# Patient Record
Sex: Female | Born: 1950 | Race: White | Hispanic: No | Marital: Married | Smoking: Former smoker
Health system: Southern US, Community
[De-identification: ages and names within clinical notes are randomized; demographics above are authoritative.]

## PROBLEM LIST (undated history)

## (undated) DIAGNOSIS — G8929 Other chronic pain: Secondary | ICD-10-CM

## (undated) DIAGNOSIS — H8110 Benign paroxysmal vertigo, unspecified ear: Secondary | ICD-10-CM

## (undated) DIAGNOSIS — C50919 Malignant neoplasm of unspecified site of unspecified female breast: Secondary | ICD-10-CM

## (undated) DIAGNOSIS — R51 Headache: Secondary | ICD-10-CM

## (undated) DIAGNOSIS — Z923 Personal history of irradiation: Secondary | ICD-10-CM

## (undated) DIAGNOSIS — G4700 Insomnia, unspecified: Secondary | ICD-10-CM

## (undated) DIAGNOSIS — G5600 Carpal tunnel syndrome, unspecified upper limb: Secondary | ICD-10-CM

## (undated) HISTORY — PX: AUGMENTATION MAMMAPLASTY: SUR837

## (undated) HISTORY — DX: Insomnia, unspecified: G47.00

## (undated) HISTORY — DX: Malignant neoplasm of unspecified site of unspecified female breast: C50.919

## (undated) HISTORY — DX: Headache: R51

## (undated) HISTORY — DX: Benign paroxysmal vertigo, unspecified ear: H81.10

## (undated) HISTORY — DX: Carpal tunnel syndrome, unspecified upper limb: G56.00

## (undated) HISTORY — PX: DIAGNOSTIC LAPAROSCOPY: SUR761

## (undated) HISTORY — DX: Other chronic pain: G89.29

---
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
## 1997-03-16 DIAGNOSIS — C50919 Malignant neoplasm of unspecified site of unspecified female breast: Secondary | ICD-10-CM

## 1997-03-16 HISTORY — DX: Malignant neoplasm of unspecified site of unspecified female breast: C50.919

## 1997-03-16 HISTORY — PX: BREAST LUMPECTOMY: SHX2

## 1997-07-04 ENCOUNTER — Other Ambulatory Visit: Admission: RE | Admit: 1997-07-04 | Discharge: 1997-07-04 | Payer: Self-pay | Admitting: Obstetrics and Gynecology

## 1997-07-23 ENCOUNTER — Other Ambulatory Visit: Admission: RE | Admit: 1997-07-23 | Discharge: 1997-07-23 | Payer: Self-pay | Admitting: Obstetrics and Gynecology

## 1998-02-01 ENCOUNTER — Encounter: Payer: Self-pay | Admitting: *Deleted

## 1998-02-01 ENCOUNTER — Ambulatory Visit (HOSPITAL_BASED_OUTPATIENT_CLINIC_OR_DEPARTMENT_OTHER): Admission: RE | Admit: 1998-02-01 | Discharge: 1998-02-01 | Payer: Self-pay | Admitting: *Deleted

## 1998-02-02 ENCOUNTER — Observation Stay (HOSPITAL_COMMUNITY): Admission: EM | Admit: 1998-02-02 | Discharge: 1998-02-02 | Payer: Self-pay | Admitting: Emergency Medicine

## 1998-02-11 ENCOUNTER — Encounter: Admission: RE | Admit: 1998-02-11 | Discharge: 1998-05-12 | Payer: Self-pay | Admitting: Radiation Oncology

## 1998-05-13 ENCOUNTER — Encounter: Admission: RE | Admit: 1998-05-13 | Discharge: 1998-08-11 | Payer: Self-pay | Admitting: Radiation Oncology

## 1999-01-10 ENCOUNTER — Encounter: Payer: Self-pay | Admitting: *Deleted

## 1999-01-10 ENCOUNTER — Encounter: Admission: RE | Admit: 1999-01-10 | Discharge: 1999-01-10 | Payer: Self-pay | Admitting: *Deleted

## 1999-07-31 ENCOUNTER — Encounter: Payer: Self-pay | Admitting: Obstetrics and Gynecology

## 1999-07-31 ENCOUNTER — Encounter: Admission: RE | Admit: 1999-07-31 | Discharge: 1999-07-31 | Payer: Self-pay | Admitting: Obstetrics and Gynecology

## 2001-03-16 HISTORY — PX: BREAST RECONSTRUCTION: SHX9

## 2001-03-16 HISTORY — PX: BREAST ENHANCEMENT SURGERY: SHX7

## 2002-02-20 ENCOUNTER — Encounter: Payer: Self-pay | Admitting: Obstetrics and Gynecology

## 2002-02-20 ENCOUNTER — Encounter: Admission: RE | Admit: 2002-02-20 | Discharge: 2002-02-20 | Payer: Self-pay | Admitting: Obstetrics and Gynecology

## 2002-04-11 ENCOUNTER — Other Ambulatory Visit: Admission: RE | Admit: 2002-04-11 | Discharge: 2002-04-11 | Payer: Self-pay | Admitting: Oncology

## 2003-03-13 ENCOUNTER — Encounter: Admission: RE | Admit: 2003-03-13 | Discharge: 2003-03-13 | Payer: Self-pay | Admitting: Obstetrics and Gynecology

## 2004-01-25 ENCOUNTER — Encounter (INDEPENDENT_AMBULATORY_CARE_PROVIDER_SITE_OTHER): Payer: Self-pay | Admitting: Specialist

## 2004-01-25 ENCOUNTER — Ambulatory Visit (HOSPITAL_COMMUNITY): Admission: RE | Admit: 2004-01-25 | Discharge: 2004-01-25 | Payer: Self-pay | Admitting: Gastroenterology

## 2004-03-13 ENCOUNTER — Encounter: Admission: RE | Admit: 2004-03-13 | Discharge: 2004-03-13 | Payer: Self-pay | Admitting: Obstetrics and Gynecology

## 2005-04-21 ENCOUNTER — Encounter: Admission: RE | Admit: 2005-04-21 | Discharge: 2005-04-21 | Payer: Self-pay | Admitting: Plastic Surgery

## 2006-06-01 ENCOUNTER — Encounter: Admission: RE | Admit: 2006-06-01 | Discharge: 2006-06-01 | Payer: Self-pay | Admitting: Obstetrics and Gynecology

## 2007-06-27 ENCOUNTER — Encounter: Admission: RE | Admit: 2007-06-27 | Discharge: 2007-06-27 | Payer: Self-pay | Admitting: Obstetrics and Gynecology

## 2009-07-02 ENCOUNTER — Encounter: Admission: RE | Admit: 2009-07-02 | Discharge: 2009-07-02 | Payer: Self-pay | Admitting: Obstetrics and Gynecology

## 2010-06-26 ENCOUNTER — Other Ambulatory Visit: Payer: Self-pay | Admitting: Family Medicine

## 2010-06-26 DIAGNOSIS — Z1231 Encounter for screening mammogram for malignant neoplasm of breast: Secondary | ICD-10-CM

## 2010-07-04 ENCOUNTER — Ambulatory Visit
Admission: RE | Admit: 2010-07-04 | Discharge: 2010-07-04 | Disposition: A | Discharge status: Home / Self Care | Payer: Medicare Other | Source: Ambulatory Visit | Attending: Family Medicine | Admitting: Family Medicine

## 2010-07-04 DIAGNOSIS — Z1231 Encounter for screening mammogram for malignant neoplasm of breast: Secondary | ICD-10-CM

## 2010-08-01 NOTE — Op Note (Signed)
NAMEMELONEY, FELD                ACCOUNT NO.:  [id number]   MEDICAL RECORD NO.:  [id number]          PATIENT TYPE:  AMB   LOCATION:  ENDO                         FACILITY:  [facility]   PHYSICIAN:  Anselmo Rod, M.D.  DATE OF BIRTH:  [birth year]   DATE OF PROCEDURE:  01/25/2004  DATE OF DISCHARGE:                                 OPERATIVE REPORT   PROCEDURE PERFORMED:  Colonoscopy with snare polypectomy x1.   ENDOSCOPIST:  Anselmo Rod, M.D.   INSTRUMENT USED:  Olympus video colonoscope (adjustable pediatric scope).   INDICATION FOR PROCEDURE:  A 60 year old white female with a personal  history of breast cancer undergoing a screening colonoscopy to rule out  colonic polyps, masses, etc.   PREPROCEDURE PREPARATION:  Informed consent was procured from the patient.  The patient was fasted for eight hours prior to the procedure and prepped  with a bottle of magnesium citrate and a gallon of GoLYTELY the night prior  to the procedure.   PREPROCEDURE PHYSICAL:  VITAL SIGNS:  The patient had stable vital signs.  NECK:  Supple.  CHEST:  Clear to auscultation.  S1, S2 regular.  ABDOMEN:  Soft with normal bowel sounds.   DESCRIPTION OF PROCEDURE:  The patient was placed in the left lateral  decubitus position and sedated with 80 mg of Demerol and 8 mg of Versed in  slow incremental doses.  Once the patient was adequately sedate and  maintained on low-flow oxygen and continuous cardiac monitoring, the Olympus  video colonoscope was advanced from the rectum to the cecum.  The  appendiceal orifice and the ileocecal valve were clearly visualized and  photographed.  A flat 4-5 mm sessile polyp was snared from 30 cm.  Retroflexion inspection revealed no abnormality, and the patient tolerated  the procedure well without complications.   IMPRESSION:  1.  Flat lobulated polyp measuring about 5-6 mm snared from 30 cm.  2.  No evidence of diverticulosis, no other masses or polyps seen.  3.   Otherwise normal colonoscopy up to the cecum.   RECOMMENDATIONS:  1.  Await pathology results.  2.  Avoid nonsteroidals including aspirin for the next four weeks.  3.  Repeat colonoscopy depending on pathology results.  4.  Outpatient follow-up as need arises in the future.       JNM/MEDQ  D:  01/25/2004  T:  01/26/2004  Job:  [id number]   cc:   Teena Irani. Arlyce Dice, M.D.  [address]  [place]  [place] [address]  Fax: [phone]   S. Kyra Manges, M.D.  [phone] [address]  [place]  [place] [address]  Fax: [phone]   Valentino Hue. Magrinat, M.D.  [address] [facility]  [place]  [place] [address]  Fax: [phone]

## 2010-11-13 ENCOUNTER — Telehealth: Payer: Self-pay | Admitting: Pulmonary Disease

## 2010-11-13 NOTE — Telephone Encounter (Signed)
Spoke with pt who has upcoming appt with [facility] as a self ref. She states that she is needing [place] to call for records at [facility]. I advised that since they did not refer her, we can not call and get her records without a signed release of info form. Pt verbalized understanding and states will call and get the records. Pt verbalized understanding.

## 2010-11-18 ENCOUNTER — Encounter: Payer: Self-pay | Admitting: Pulmonary Disease

## 2010-11-19 ENCOUNTER — Encounter: Payer: Self-pay | Admitting: Pulmonary Disease

## 2010-11-19 ENCOUNTER — Ambulatory Visit (INDEPENDENT_AMBULATORY_CARE_PROVIDER_SITE_OTHER): Payer: Medicare Other | Admitting: Pulmonary Disease

## 2010-11-19 VITALS — BP 142/102 | HR 63 | Temp 98.1°F | Wt 122.0 lb

## 2010-11-19 DIAGNOSIS — G4700 Insomnia, unspecified: Secondary | ICD-10-CM

## 2010-11-19 NOTE — Progress Notes (Signed)
Subjective:    Patient ID: Mandy Best, female    DOB: 1950-06-14, 60 y.o.   MRN: [id number]  HPI 60 yo female for evaluation of insomnia.  This has been present since at least 1995.  She has trouble falling asleep and staying asleep.  She says that she can stay awake for days.  She will lay down to "rest", but will not actually sleep.  She has been evaluated previously at [facility].  She had sleep test from 1998 which showed unexplained oxygen desaturation with REM sleep.  There was also concern for sleep state misperception.  She does not recall anything specifically which triggered her sleep problems.  She is not able to specify an exact sleep time or wake time.  She says she typically has no sleep.  She feels like she will get to the brink of sleep and then wake up.  She tosses through out the night.  When she can't sleep she will watch TV, read her bible, or do household chores.  She will try to lay down again when she feels tired.  She will typically not start her day until 2 pm, but feels like she can't get out of bed.  She can sometimes lay in bed for up to 2 days.  She thinks she sleeps for only about 2 hours per week, but can "rest" in bed for several hours per day.  In spite of her perceived lack of sleep she is able to keep up with her daily activities.  She has used multiple sleep aides in the past.  None of these helped, and she had trouble with side effects mostly related to nightmares.  She does not feel like she is depressed.  There is no prior history of head injury or seizures.  She denies snoring, or witnessed apnea.  She does not have symptoms of restless legs.  There is no history of sleep hallucinations, sleep paralysis, or cataplexy.  There is no significant history of tobacco abuse, and she does not drink alcohol.  She used to work night shift years ago.  She was also a Geophysicist/field seismologist.  She has been on disability due to her sleep problems and chronic fatigue.  Her  Epworth score is 0 out of 24.  Past Medical History  Diagnosis Date  . Insomnia   . Chronic headaches   . Breast cancer 1999    Left  . Osteoporosis   . BPPV (benign paroxysmal positional vertigo)   . Carpal tunnel syndrome      Family History  Problem Relation Age of Onset  . Diabetes Mother      History   Social History  . Marital Status: Married    Number of Children: 2   Occupational History  . teachers assistant     disabled   Social History Main Topics  . Smoking status: Former Smoker -- 3 years    Quit date: 03/16/1978  . Smokeless tobacco: Not on file   Comment: 1 pack x 2 weeks  . Alcohol Use: No  . Drug Use: No   Allergies  Allergen Reactions  . Ambien   . Amoxicillin   . Benadryl (Diphenhydramine Hcl)   . Claritin   . Flexeril (Cyclobenzaprine Hcl)   . Hydrocodone   . Jasmine Oil   . Mucinex Dm (Mucus Relief Dm)   . Phenylephrine   . Prednisone   . St [employer]   . Sudafed (Pseudoephedrine Hcl)   . Tropicamide   .  Tylenol (Acetaminophen)   . Xanax   . Azithromycin Rash  . Lunesta Rash    Strange dreams  . Prozac (Fluoxetine Hcl) Rash    Strange dreams  . Wellbutrin (Bupropion Hcl) Rash  . Zoloft Rash    Strange dreams    Review of Systems     Objective:   Physical Exam  BP 142/102  Pulse 63  Temp(Src) 98.1 F (36.7 C) (Oral)  Wt 122 lb (55.339 kg)  SpO2 100%  General - thin, healthy HEENT - PERRLA, EOMI, no sinus tenderness, no oral exudate, poor dentition, no LAN, no thyromegaly Cardiac - s1s2 regular, no murmur, pulses symmetric Chest - normal excursion, no wheeze/rales/dullness Abd - soft, nontender, no organomegaly Ext - no e/c/c Neuro - normal strength, CN intact Psych - normal mood, behavior     Assessment & Plan:   Insomnia She has a long history of sleep difficulties.  In spite of having several previous evaluations for this she appears to not have appropriate incite for her problem.  She has continued to  maintain poor sleep habits.  In addition she likely does not perceive how much sleep she is actually getting.  I reviewed the importance of sleep restriction, relaxation techniques, and stimulus control.  I have asked her to keep a sleep diary.  Reviewed proper sleep hygiene techniques.  Advised her that she could try OTC melatonin 3 to 5 mg 30 to 45 minutes before desired bedtime.  Will refrain from using additional sleep aides at this time, since she had difficulty tolerating these and these medications did not seem to be effective.  I emphasized the importance of having realistic expectations for her sleep.  The main focus to try allow her to get enough sleep to maintain her daily functions.  I explained that this problem has been present for years, and will not likely resolve quickly.  I do not think she needs additional sleep testing at present.    Updated Medication List Outpatient Encounter Prescriptions as of 11/19/2010  Medication Sig Dispense Refill  . aspirin 81 MG tablet Take 81 mg by mouth daily.        . calcipotriene-betamethasone (TACLONEX SCALP) external suspension As needed       . Calcium Carb-Cholecalciferol (LIQUID CALCIUM WITH D3 PO) 1 oz daily       . DISCONTD: aspirin 325 MG tablet Take 325 mg by mouth daily.        Marland Kitchen DISCONTD: Eszopiclone (ESZOPICLONE) 3 MG TABS Take 3 mg by mouth at bedtime. Take immediately before bedtime       . DISCONTD: TRAZODONE HCL PO 2 tablets at bedtime

## 2010-11-19 NOTE — Patient Instructions (Signed)
Keep sleep diary for two weeks Go to bed at 130 am, and get out of bed at 730 am. Try melatonin 3 to 5 mg 30 to 45 minutes before bedtime Follow up in 4 weeks

## 2010-11-30 ENCOUNTER — Encounter: Payer: Self-pay | Admitting: Pulmonary Disease

## 2010-11-30 DIAGNOSIS — G4700 Insomnia, unspecified: Secondary | ICD-10-CM | POA: Insufficient documentation

## 2010-11-30 NOTE — Assessment & Plan Note (Addendum)
She has a long history of sleep difficulties.  In spite of having several previous evaluations for this she appears to not have appropriate incite for her problem.  She has continued to maintain poor sleep habits.  In addition she likely does not perceive how much sleep she is actually getting.  I reviewed the importance of sleep restriction, relaxation techniques, and stimulus control.  I have asked her to keep a sleep diary.  Reviewed proper sleep hygiene techniques.  Advised her that she could try OTC melatonin 3 to 5 mg 30 to 45 minutes before desired bedtime.  Will refrain from using additional sleep aides at this time, since she had difficulty tolerating these and these medications did not seem to be effective.  I emphasized the importance of having realistic expectations for her sleep.  The main focus to try allow her to get enough sleep to maintain her daily functions.  I explained that this problem has been present for years, and will not likely resolve quickly.  I do not think she needs additional sleep testing at present.

## 2010-12-04 ENCOUNTER — Telehealth: Payer: Self-pay | Admitting: Pulmonary Disease

## 2010-12-04 NOTE — Telephone Encounter (Signed)
Noted  

## 2010-12-04 NOTE — Telephone Encounter (Signed)
I spoke with pt and she states she has been trying to get up at 7:30 as Dr. Craige Cotta has advised her to do. Pt states she is getting up at 10 and feels like she wants to wait until she starts waking up at 7:30 before she comes back to see Dr. Craige Cotta. She states she has been taking the melatonin as Dr. Craige Cotta advised her too and feels it is helping her sleep. Pt states although she is still feeling tired. Pt states she is still going to continue the sleep dairy and will call back when she is ready for an apt.  I will forward to Dr. Craige Cotta as an Lorain Childes. Please advise Dr. Craige Cotta. Thanks  Carver Fila, CMA

## 2010-12-17 ENCOUNTER — Ambulatory Visit: Payer: Medicare Other | Admitting: Pulmonary Disease

## 2011-05-29 ENCOUNTER — Other Ambulatory Visit: Payer: Self-pay | Admitting: Family Medicine

## 2011-05-29 DIAGNOSIS — Z1231 Encounter for screening mammogram for malignant neoplasm of breast: Secondary | ICD-10-CM

## 2011-06-01 ENCOUNTER — Other Ambulatory Visit: Payer: Self-pay | Admitting: Family Medicine

## 2011-07-06 ENCOUNTER — Ambulatory Visit: Payer: Medicare Other

## 2011-07-06 ENCOUNTER — Ambulatory Visit
Admission: RE | Admit: 2011-07-06 | Discharge: 2011-07-06 | Disposition: A | Discharge status: Home / Self Care | Payer: Self-pay | Source: Ambulatory Visit | Attending: Family Medicine | Admitting: Family Medicine

## 2011-07-06 DIAGNOSIS — Z1231 Encounter for screening mammogram for malignant neoplasm of breast: Secondary | ICD-10-CM

## 2011-08-17 ENCOUNTER — Encounter (HOSPITAL_BASED_OUTPATIENT_CLINIC_OR_DEPARTMENT_OTHER): Payer: Self-pay | Admitting: *Deleted

## 2011-08-21 ENCOUNTER — Encounter (HOSPITAL_BASED_OUTPATIENT_CLINIC_OR_DEPARTMENT_OTHER)
Admission: RE | Disposition: A | Discharge status: Home / Self Care | Payer: Self-pay | Source: Ambulatory Visit | Attending: Plastic Surgery

## 2011-08-21 ENCOUNTER — Ambulatory Visit (HOSPITAL_BASED_OUTPATIENT_CLINIC_OR_DEPARTMENT_OTHER): Payer: Medicare Other | Admitting: Anesthesiology

## 2011-08-21 ENCOUNTER — Ambulatory Visit (HOSPITAL_BASED_OUTPATIENT_CLINIC_OR_DEPARTMENT_OTHER)
Admission: RE | Admit: 2011-08-21 | Discharge: 2011-08-21 | Disposition: A | Discharge status: Home / Self Care | Payer: Medicare Other | Source: Ambulatory Visit | Attending: Plastic Surgery | Admitting: Plastic Surgery

## 2011-08-21 ENCOUNTER — Encounter (HOSPITAL_BASED_OUTPATIENT_CLINIC_OR_DEPARTMENT_OTHER): Payer: Self-pay | Admitting: Anesthesiology

## 2011-08-21 ENCOUNTER — Encounter (HOSPITAL_BASED_OUTPATIENT_CLINIC_OR_DEPARTMENT_OTHER): Payer: Self-pay | Admitting: *Deleted

## 2011-08-21 DIAGNOSIS — Z923 Personal history of irradiation: Secondary | ICD-10-CM | POA: Insufficient documentation

## 2011-08-21 DIAGNOSIS — Y849 Medical procedure, unspecified as the cause of abnormal reaction of the patient, or of later complication, without mention of misadventure at the time of the procedure: Secondary | ICD-10-CM | POA: Insufficient documentation

## 2011-08-21 DIAGNOSIS — Z853 Personal history of malignant neoplasm of breast: Secondary | ICD-10-CM | POA: Insufficient documentation

## 2011-08-21 DIAGNOSIS — T8549XA Other mechanical complication of breast prosthesis and implant, initial encounter: Secondary | ICD-10-CM | POA: Insufficient documentation

## 2011-08-21 LAB — POCT HEMOGLOBIN-HEMACUE: Hemoglobin: 12.7 g/dL (ref 12.0–15.0)

## 2011-08-21 SURGERY — BREAST CAPSULOTOMY WITH IMPLANT EXCHANGE
Anesthesia: General | Site: Breast | Laterality: Right | Wound class: Clean

## 2011-08-21 MED ORDER — METOCLOPRAMIDE HCL 5 MG/ML IJ SOLN
INTRAMUSCULAR | Status: DC | PRN
  Administered 2011-08-21: 10 mg via INTRAVENOUS

## 2011-08-21 MED ORDER — LIDOCAINE HCL (CARDIAC) 20 MG/ML IV SOLN
INTRAVENOUS | Status: DC | PRN
  Administered 2011-08-21: 50 mg via INTRAVENOUS

## 2011-08-21 MED ORDER — PROPOFOL 10 MG/ML IV EMUL
INTRAVENOUS | Status: DC | PRN
  Administered 2011-08-21: 200 mg via INTRAVENOUS

## 2011-08-21 MED ORDER — SODIUM CHLORIDE 0.9 % IR SOLN
Status: DC | PRN
  Administered 2011-08-21: 08:00:00

## 2011-08-21 MED ORDER — EPHEDRINE SULFATE 50 MG/ML IJ SOLN
INTRAMUSCULAR | Status: DC | PRN
  Administered 2011-08-21: 10 mg via INTRAVENOUS

## 2011-08-21 MED ORDER — SCOPOLAMINE 1 MG/3DAYS TD PT72
1.0000 | MEDICATED_PATCH | TRANSDERMAL | Status: DC
  Administered 2011-08-21: 1.5 mg via TRANSDERMAL

## 2011-08-21 MED ORDER — LACTATED RINGERS IV SOLN
INTRAVENOUS | Status: DC
  Administered 2011-08-21 (×2): via INTRAVENOUS

## 2011-08-21 MED ORDER — SODIUM CHLORIDE 0.9 % IR SOLN
Status: DC | PRN
  Administered 2011-08-21: 270 mL

## 2011-08-21 MED ORDER — CLINDAMYCIN PHOSPHATE 600 MG/50ML IV SOLN
600.0000 mg | Freq: Once | INTRAVENOUS | Status: AC
  Administered 2011-08-21: 600 mg via INTRAVENOUS

## 2011-08-21 MED ORDER — FENTANYL CITRATE 0.05 MG/ML IJ SOLN
INTRAMUSCULAR | Status: DC | PRN
  Administered 2011-08-21 (×2): 50 ug via INTRAVENOUS

## 2011-08-21 MED ORDER — SUCCINYLCHOLINE CHLORIDE 20 MG/ML IJ SOLN
INTRAMUSCULAR | Status: DC | PRN
  Administered 2011-08-21: 100 mg via INTRAVENOUS

## 2011-08-21 MED ORDER — ONDANSETRON HCL 4 MG/2ML IJ SOLN
INTRAMUSCULAR | Status: DC | PRN
  Administered 2011-08-21: 4 mg via INTRAVENOUS

## 2011-08-21 MED ORDER — MIDAZOLAM HCL 5 MG/5ML IJ SOLN
INTRAMUSCULAR | Status: DC | PRN
  Administered 2011-08-21: 2 mg via INTRAVENOUS

## 2011-08-21 SURGICAL SUPPLY — 62 items
BAG DECANTER FOR FLEXI CONT (MISCELLANEOUS) ×3 IMPLANT
BANDAGE ELASTIC 6 VELCRO ST LF (GAUZE/BANDAGES/DRESSINGS) ×3 IMPLANT
BLADE HEX COATED 2.75 (ELECTRODE) ×3 IMPLANT
BLADE SURG 10 STRL SS (BLADE) ×3 IMPLANT
BLADE SURG 15 STRL LF DISP TIS (BLADE) ×2 IMPLANT
BLADE SURG 15 STRL SS (BLADE) ×1
CANISTER SUCTION 1200CC (MISCELLANEOUS) ×3 IMPLANT
CHLORAPREP W/TINT 26ML (MISCELLANEOUS) ×3 IMPLANT
CLEANER CAUTERY TIP 5X5 PAD (MISCELLANEOUS) IMPLANT
CLOTH BEACON ORANGE TIMEOUT ST (SAFETY) ×3 IMPLANT
COVER MAYO STAND STRL (DRAPES) ×3 IMPLANT
COVER TABLE BACK 60X90 (DRAPES) ×3 IMPLANT
DECANTER SPIKE VIAL GLASS SM (MISCELLANEOUS) IMPLANT
DERMABOND ADVANCED (GAUZE/BANDAGES/DRESSINGS) ×1
DERMABOND ADVANCED .7 DNX12 (GAUZE/BANDAGES/DRESSINGS) ×2 IMPLANT
DRAPE LAPAROSCOPIC ABDOMINAL (DRAPES) ×3 IMPLANT
DRAPE SURG 17X23 STRL (DRAPES) ×3 IMPLANT
DRSG PAD ABDOMINAL 8X10 ST (GAUZE/BANDAGES/DRESSINGS) ×3 IMPLANT
ELECT BLADE 4.0 EZ CLEAN MEGAD (MISCELLANEOUS)
ELECT REM PT RETURN 9FT ADLT (ELECTROSURGICAL) ×3
ELECTRODE BLDE 4.0 EZ CLN MEGD (MISCELLANEOUS) IMPLANT
ELECTRODE REM PT RTRN 9FT ADLT (ELECTROSURGICAL) ×2 IMPLANT
EVACUATOR SILICONE 100CC (DRAIN) IMPLANT
FILTER 7/8 IN (FILTER) ×3 IMPLANT
GAUZE SPONGE 4X4 12PLY STRL LF (GAUZE/BANDAGES/DRESSINGS) IMPLANT
GAUZE XEROFORM 1X8 LF (GAUZE/BANDAGES/DRESSINGS) IMPLANT
GAUZE XEROFORM 5X9 LF (GAUZE/BANDAGES/DRESSINGS) IMPLANT
GLOVE BIO SURGEON STRL SZ7.5 (GLOVE) ×3 IMPLANT
GLOVE BIOGEL M 7.0 STRL (GLOVE) ×3 IMPLANT
GLOVE BIOGEL PI IND STRL 7.5 (GLOVE) ×2 IMPLANT
GLOVE BIOGEL PI IND STRL 8 (GLOVE) ×2 IMPLANT
GLOVE BIOGEL PI INDICATOR 7.5 (GLOVE) ×1
GLOVE BIOGEL PI INDICATOR 8 (GLOVE) ×1
GOWN PREVENTION PLUS XLARGE (GOWN DISPOSABLE) ×3 IMPLANT
GOWN PREVENTION PLUS XXLARGE (GOWN DISPOSABLE) ×6 IMPLANT
IMPL SALINE MOD PLUS 225CC (Breast) ×2 IMPLANT
IMPLANT SALINE MOD PLUS 225CC (Breast) ×3 IMPLANT
IV NS 500ML (IV SOLUTION) ×1
IV NS 500ML BAXH (IV SOLUTION) ×2 IMPLANT
KIT FILL MCGHAN 30CC (MISCELLANEOUS) ×3 IMPLANT
NEEDLE 27GAX1X1/2 (NEEDLE) IMPLANT
NS IRRIG 1000ML POUR BTL (IV SOLUTION) ×3 IMPLANT
PACK BASIN DAY SURGERY FS (CUSTOM PROCEDURE TRAY) ×3 IMPLANT
PAD CLEANER CAUTERY TIP 5X5 (MISCELLANEOUS)
PAD EYE OVAL STERILE LF (GAUZE/BANDAGES/DRESSINGS) IMPLANT
PIN SAFETY STERILE (MISCELLANEOUS) IMPLANT
SLEEVE SCD COMPRESS KNEE MED (MISCELLANEOUS) IMPLANT
SPONGE GAUZE 4X4 12PLY (GAUZE/BANDAGES/DRESSINGS) ×3 IMPLANT
SPONGE LAP 18X18 X RAY DECT (DISPOSABLE) ×6 IMPLANT
STRIP CLOSURE SKIN 1/2X4 (GAUZE/BANDAGES/DRESSINGS) IMPLANT
SUT MNCRL AB 3-0 PS2 18 (SUTURE) ×3 IMPLANT
SUT MNCRL AB 4-0 PS2 18 (SUTURE) ×3 IMPLANT
SUT VICRYL 3-0 CR8 SH (SUTURE) ×3 IMPLANT
SYR 50ML LL SCALE MARK (SYRINGE) ×3 IMPLANT
SYR BULB IRRIGATION 50ML (SYRINGE) ×6 IMPLANT
TOWEL OR 17X24 6PK STRL BLUE (TOWEL DISPOSABLE) ×6 IMPLANT
TOWEL OR NON WOVEN STRL DISP B (DISPOSABLE) ×3 IMPLANT
TUBE CONNECTING 20X1/4 (TUBING) ×3 IMPLANT
UNDERPAD 30X30 INCONTINENT (UNDERPADS AND DIAPERS) ×6 IMPLANT
VAC PENCILS W/TUBING CLEAR (MISCELLANEOUS) ×3 IMPLANT
WATER STERILE IRR 1000ML POUR (IV SOLUTION) ×3 IMPLANT
YANKAUER SUCT BULB TIP NO VENT (SUCTIONS) ×3 IMPLANT

## 2011-08-21 NOTE — Anesthesia Preprocedure Evaluation (Signed)
Anesthesia Evaluation  Patient identified by MRN, date of birth, ID band Patient awake    Reviewed: Allergy & Precautions, H&P , NPO status , Patient's Chart, lab work & pertinent test results, reviewed documented beta blocker date and time   Airway Mallampati: II TM Distance: >3 FB Neck ROM: full    Dental   Pulmonary neg pulmonary ROS,          Cardiovascular negative cardio ROS      Neuro/Psych  Headaches,  Neuromuscular disease negative psych ROS   GI/Hepatic negative GI ROS, Neg liver ROS,   Endo/Other  negative endocrine ROS  Renal/GU negative Renal ROS  negative genitourinary   Musculoskeletal   Abdominal   Peds  Hematology negative hematology ROS (+)   Anesthesia Other Findings See surgeon's H&P   Reproductive/Obstetrics negative OB ROS                           Anesthesia Physical Anesthesia Plan  ASA: II  Anesthesia Plan: General   Post-op Pain Management:    Induction: Intravenous  Airway Management Planned: Oral ETT  Additional Equipment:   Intra-op Plan:   Post-operative Plan: Extubation in OR  Informed Consent: I have reviewed the patients History and Physical, chart, labs and discussed the procedure including the risks, benefits and alternatives for the proposed anesthesia with the patient or authorized representative who has indicated his/her understanding and acceptance.   Dental Advisory Given  Plan Discussed with: CRNA and Surgeon  Anesthesia Plan Comments:         Anesthesia Quick Evaluation

## 2011-08-21 NOTE — Brief Op Note (Signed)
08/21/2011  8:54 AM  PATIENT:  Mandy Best  61 y.o. female  PRE-OPERATIVE DIAGNOSIS: personal history left breast cancer and mechanical complication of right breast implant  POST-OPERATIVE DIAGNOSIS:  same  PROCEDURE:  Procedure(s) (LRB): BREAST CAPSULOTOMY WITH IMPLANT EXCHANGE (Right)  SURGEON:  Surgeon(s) and Role:    * Etter Sjogren, MD - Primary  PHYSICIAN ASSISTANT:   ASSISTANTS: none   ANESTHESIA:   general  EBL:     BLOOD ADMINISTERED:none  DRAINS: none   LOCAL MEDICATIONS USED:  NONE  SPECIMEN:  No Specimen  DISPOSITION OF SPECIMEN:  N/A  COUNTS:  YES  TOURNIQUET:  * No tourniquets in log *  DICTATION: .Other Dictation: Dictation Number [id number]  PLAN OF CARE: Discharge to home after PACU  PATIENT DISPOSITION:  PACU - hemodynamically stable.   Delay start of Pharmacological VTE agent (>24hrs) due to surgical blood loss or risk of bleeding: yes

## 2011-08-21 NOTE — Anesthesia Procedure Notes (Signed)
Procedure Name: Intubation Date/Time: 08/21/2011 7:57 AM Performed by: Gar Gibbon Pre-anesthesia Checklist: Patient identified, Emergency Drugs available, Suction available and Patient being monitored Patient Re-evaluated:Patient Re-evaluated prior to inductionOxygen Delivery Method: Circle System Utilized Preoxygenation: Pre-oxygenation with 100% oxygen Intubation Type: IV induction Ventilation: Mask ventilation without difficulty Laryngoscope Size: Mac and 3 Grade View: Grade I Tube type: Oral Tube size: 7.0 mm Number of attempts: 1 Airway Equipment and Method: stylet and oral airway Placement Confirmation: ETT inserted through vocal cords under direct vision,  positive ETCO2 and breath sounds checked- equal and bilateral Secured at: 20 cm Tube secured with: Tape Dental Injury: Teeth and Oropharynx as per pre-operative assessment

## 2011-08-21 NOTE — Anesthesia Postprocedure Evaluation (Signed)
Anesthesia Post Note  Patient: Mandy Best  Procedure(s) Performed: Procedure(s) (LRB): BREAST CAPSULOTOMY WITH IMPLANT EXCHANGE (Right)  Anesthesia type: General  Patient location: PACU  Post pain: Pain level controlled  Post assessment: Patient's Cardiovascular Status Stable  Last Vitals:  Filed Vitals:   08/21/11 1100  BP: 140/72  Pulse: 78  Temp:   Resp: 18    Post vital signs: Reviewed and stable  Level of consciousness: alert  Complications: No apparent anesthesia complications

## 2011-08-21 NOTE — Discharge Instructions (Addendum)
No lifting for 2 weeks  No vigorous activity for 2 weeks (including outdoor walks) No driving for 1 week OK to walk up stairs slowly Stay propped up Take an over-the-counter Probiotic while on antibiotics Take an over-the-counter stool softener (such as Colace) while on pain medication Return to office Monday OK to remove dressing and shower tomorrow night. Place sports bra after shower and wear night and day. Post Anesthesia Home Care Instructions  Activity: Get plenty of rest for the remainder of the day. A responsible adult should stay with you for 24 hours following the procedure.  For the next 24 hours, DO NOT: -Drive a car -Advertising copywriter -Drink alcoholic beverages -Take any medication unless instructed by your physician -Make any legal decisions or sign important papers.  Meals: Start with liquid foods such as gelatin or soup. Progress to regular foods as tolerated. Avoid greasy, spicy, heavy foods. If nausea and/or vomiting occur, drink only clear liquids until the nausea and/or vomiting subsides. Call your physician if vomiting continues.  Special Instructions/Symptoms: Your throat may feel dry or sore from the anesthesia or the breathing tube placed in your throat during surgery. If this causes discomfort, gargle with warm salt water. The discomfort should disappear within 24 hours.

## 2011-08-21 NOTE — H&P (Signed)
I have reexamined and reevaluated the patient and there are no changes. See office notes.

## 2011-08-21 NOTE — Op Note (Signed)
NAMESHARYN, BRILLIANT                ACCOUNT NO.:  [id number]  MEDICAL RECORD NO.:  [id number]  LOCATION:                                 FACILITY:  PHYSICIAN:  Etter Sjogren, M.D.          DATE OF BIRTH:  DATE OF PROCEDURE: DATE OF DISCHARGE:                              OPERATIVE REPORT   PREOPERATIVE DIAGNOSES: 1. 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 2. Personal history of breast cancer, left side.  POSTOPERATIVE DIAGNOSES: 1. 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 2. Personal history of breast cancer, left side.  PROCEDURE:  Removal and replacement of a right ruptured breast implant.  SURGEON:  Etter Sjogren, M.D.  ANESTHESIA:  General.  ESTIMATED BLOOD LOSS:  2 mL.  CLINICAL NOTE:  A 61 year old woman with history of breast cancer, lumpectomy, and radiation on the left, reconstruction on the left with implant, and right side augmentation for symmetry.  She has had deflation.  Her surgeries were 12 years ago, and she presents today for exchange of the implant on the right, removal of ruptured implant and replacement.  The nature of procedure, risks, possible complications discussed with her including but not limited to, bleeding, infection, anesthesia related complications, healing problems, scarring, loss of sensation, fluid accumulations, pneumothorax, pulmonary embolism, asymmetry, disappointment, failure of device, capsular contracture, displacement of device, wrinkles, ripples, and diminished sensation of the nipple areolar complex.  She understood all this and wished to proceed.  DESCRIPTION OF PROCEDURE:  The patient was taken to the operating room and placed supine.  After successful induction of general anesthesia, she was prepped with ChloraPrep and after waiting full 3 minutes for drying, she was draped with sterile drapes.  Nipple shields were used. The inframammary crease incision was utilized.  Dissection carried down to the  subcutaneous tissue, through the capsule.  The underlying submuscular space was identified and the ruptured implant was removed. Saline was placed for irrigation.  Antibiotic solution was placed and allowed to dwell and the implant was prepared after thoroughly cleaning gloves.  This was a Mentor smooth, round, moderate plus profile, maximum fill 270 mL implant.  The implant was soaked in antibiotic solution for greater than 5 minutes.  A 100 mL sterile saline placed using closed filling system and the implant was returned to the antibiotic solution. The space was checked.  Excellent hemostasis was confirmed.  A 3-0 Vicryl sutures preplaced but left untied and the implant was then positioned, filled with maximum 270 mL.  Care was taken to make sure that it was oriented properly.  Antibiotic solution was placed in the space prior to position the implant.  A 3-0 Vicryl sutures then tied securely and the wound again irrigated and 3-0 Monocryl interrupted inverted deep dermal sutures and a running 3-0 Monocryl subcuticular suture completed the closure.  Dermabond was applied. Dry sterile dressing and circumferential Ace wrap.  She was transferred to the recovery in stable, having tolerated the procedure well.  DISPOSITION:  She will follow up in the office on Monday.     Etter Sjogren, M.D.     DB/MEDQ  D:  08/21/2011  T:  08/21/2011  Job:  [id number]

## 2011-08-21 NOTE — Transfer of Care (Signed)
Immediate Anesthesia Transfer of Care Note  Patient: Mandy Best  Procedure(s) Performed: Procedure(s) (LRB): BREAST CAPSULOTOMY WITH IMPLANT EXCHANGE (Right)  Patient Location: PACU  Anesthesia Type: General  Level of Consciousness: sedated  Airway & Oxygen Therapy: Patient Spontanous Breathing and Patient connected to face mask oxygen  Post-op Assessment: Report given to PACU RN and Post -op Vital signs reviewed and stable  Post vital signs: Reviewed and stable  Complications: No apparent anesthesia complications

## 2012-06-09 ENCOUNTER — Other Ambulatory Visit: Payer: Self-pay

## 2012-06-09 DIAGNOSIS — Z1231 Encounter for screening mammogram for malignant neoplasm of breast: Secondary | ICD-10-CM

## 2012-06-09 DIAGNOSIS — Z853 Personal history of malignant neoplasm of breast: Secondary | ICD-10-CM

## 2012-06-09 DIAGNOSIS — Z9889 Other specified postprocedural states: Secondary | ICD-10-CM

## 2012-07-29 ENCOUNTER — Ambulatory Visit
Admission: RE | Admit: 2012-07-29 | Discharge: 2012-07-29 | Disposition: A | Discharge status: Home / Self Care | Payer: Medicare Other | Source: Ambulatory Visit

## 2012-07-29 DIAGNOSIS — Z853 Personal history of malignant neoplasm of breast: Secondary | ICD-10-CM

## 2012-07-29 DIAGNOSIS — Z9889 Other specified postprocedural states: Secondary | ICD-10-CM

## 2012-07-29 DIAGNOSIS — Z1231 Encounter for screening mammogram for malignant neoplasm of breast: Secondary | ICD-10-CM

## 2013-07-05 ENCOUNTER — Other Ambulatory Visit: Payer: Self-pay

## 2013-07-05 DIAGNOSIS — Z1231 Encounter for screening mammogram for malignant neoplasm of breast: Secondary | ICD-10-CM

## 2013-07-31 ENCOUNTER — Ambulatory Visit
Admission: RE | Admit: 2013-07-31 | Discharge: 2013-07-31 | Disposition: A | Discharge status: Home / Self Care | Payer: Commercial Managed Care - HMO | Source: Ambulatory Visit

## 2013-07-31 ENCOUNTER — Encounter (INDEPENDENT_AMBULATORY_CARE_PROVIDER_SITE_OTHER): Payer: Self-pay

## 2013-07-31 DIAGNOSIS — Z1231 Encounter for screening mammogram for malignant neoplasm of breast: Secondary | ICD-10-CM

## 2013-12-01 ENCOUNTER — Other Ambulatory Visit: Payer: Self-pay | Admitting: Family Medicine

## 2013-12-01 DIAGNOSIS — M810 Age-related osteoporosis without current pathological fracture: Secondary | ICD-10-CM

## 2013-12-12 ENCOUNTER — Ambulatory Visit
Admission: RE | Admit: 2013-12-12 | Discharge: 2013-12-12 | Disposition: A | Discharge status: Home / Self Care | Payer: Commercial Managed Care - HMO | Source: Ambulatory Visit | Attending: Family Medicine | Admitting: Family Medicine

## 2013-12-12 DIAGNOSIS — M810 Age-related osteoporosis without current pathological fracture: Secondary | ICD-10-CM

## 2013-12-28 ENCOUNTER — Telehealth: Payer: Self-pay | Admitting: Pulmonary Disease

## 2013-12-28 NOTE — Telephone Encounter (Signed)
lmomtcb x1 

## 2013-12-29 NOTE — Telephone Encounter (Signed)
Pt has not been seen since 11/19/10 by Dr. Halford Chessman.  Pending appt 02/23/14. Pt reports she has chronic insomnia/fatigue. She reports her friend takes alprazolam 1 mg at bedtime for the same symptoms. She spoke with PCP and was told she needed to speak with Dr. Halford Chessman regarding this. Pt does not want to wait until 02/23/14 to have something called in. She wants to know if she can see TP for her sleep issues since Dr. Halford Chessman has nothing sooner. Please advise thanks

## 2013-12-29 NOTE — Telephone Encounter (Signed)
Pt has returned call. She will be home for the rest of the day - [phone]

## 2013-12-30 NOTE — Telephone Encounter (Signed)
Mandy Best has not been seen in our office since September 2012.  Mandy Best would need new consultation visit.  Please inform her that we can not prescribe new medication like this without having office evaluation first.  Mandy Best can have initial evaluation with Tammy Parrett, but would like need to review sleep hygiene first before prescribing any new medications to help her sleep.

## 2014-01-01 NOTE — Telephone Encounter (Signed)
Pt has new appt on 12/11 Nothing further needed at this time.

## 2014-01-01 NOTE — Telephone Encounter (Signed)
Called pt, LM to return call Pt needs Consult appt with VS

## 2014-02-23 ENCOUNTER — Ambulatory Visit (INDEPENDENT_AMBULATORY_CARE_PROVIDER_SITE_OTHER): Payer: Medicare HMO | Admitting: Pulmonary Disease

## 2014-02-23 ENCOUNTER — Ambulatory Visit: Payer: Commercial Managed Care - HMO | Admitting: Pulmonary Disease

## 2014-02-23 ENCOUNTER — Encounter (INDEPENDENT_AMBULATORY_CARE_PROVIDER_SITE_OTHER): Payer: Self-pay

## 2014-02-23 ENCOUNTER — Encounter: Payer: Self-pay | Admitting: Pulmonary Disease

## 2014-02-23 ENCOUNTER — Other Ambulatory Visit (INDEPENDENT_AMBULATORY_CARE_PROVIDER_SITE_OTHER): Payer: Medicare HMO

## 2014-02-23 VITALS — BP 134/80 | HR 62 | Ht 62.0 in | Wt 136.0 lb

## 2014-02-23 DIAGNOSIS — G4700 Insomnia, unspecified: Secondary | ICD-10-CM

## 2014-02-23 DIAGNOSIS — R5382 Chronic fatigue, unspecified: Secondary | ICD-10-CM

## 2014-02-23 LAB — COMPREHENSIVE METABOLIC PANEL
ALT: 16 U/L (ref 0–35)
AST: 18 U/L (ref 0–37)
Albumin: 3.9 g/dL (ref 3.5–5.2)
Alkaline Phosphatase: 83 U/L (ref 39–117)
BILIRUBIN TOTAL: 0.6 mg/dL (ref 0.2–1.2)
BUN: 9 mg/dL (ref 6–23)
CO2: 25 meq/L (ref 19–32)
CREATININE: 0.7 mg/dL (ref 0.4–1.2)
Calcium: 9 mg/dL (ref 8.4–10.5)
Chloride: 104 mEq/L (ref 96–112)
GFR: 84.4 mL/min (ref >60.00)
GLUCOSE: 94 mg/dL (ref 70–99)
Potassium: 4 mEq/L (ref 3.5–5.1)
Sodium: 135 mEq/L (ref 135–145)
Total Protein: 7 g/dL (ref 6.0–8.3)

## 2014-02-23 LAB — CBC WITH DIFFERENTIAL/PLATELET
Basophils Absolute: 0 10*3/uL (ref 0.0–0.1)
Basophils Relative: 0.3 % (ref 0.0–3.0)
Eosinophils Absolute: 0.1 10*3/uL (ref 0.0–0.7)
Eosinophils Relative: 1.5 % (ref 0.0–5.0)
HEMATOCRIT: 42 % (ref 36.0–46.0)
Hemoglobin: 14.1 g/dL (ref 12.0–15.0)
LYMPHS ABS: 1.6 10*3/uL (ref 0.7–4.0)
Lymphocytes Relative: 19.2 % (ref 12.0–46.0)
MCHC: 33.5 g/dL (ref 30.0–36.0)
MCV: 86.5 fl (ref 78.0–100.0)
Monocytes Absolute: 0.5 10*3/uL (ref 0.1–1.0)
Monocytes Relative: 5.8 % (ref 3.0–12.0)
NEUTROS ABS: 6.1 10*3/uL (ref 1.4–7.7)
Neutrophils Relative %: 73.2 % (ref 43.0–77.0)
PLATELETS: 327 10*3/uL (ref 150.0–400.0)
RBC: 4.86 Mil/uL (ref 3.87–5.11)
RDW: 12.9 % (ref 11.5–15.5)
WBC: 8.4 10*3/uL (ref 4.0–10.5)

## 2014-02-23 LAB — RHEUMATOID FACTOR: Rhuematoid fact SerPl-aCnc: <10 IU/mL (ref <=14)

## 2014-02-23 LAB — SEDIMENTATION RATE: Sed Rate: 9 mm/hr (ref 0–22)

## 2014-02-23 MED ORDER — ALPRAZOLAM 0.5 MG PO TABS: 0.5000 mg | ORAL_TABLET | Freq: Every day | ORAL | Status: DC

## 2014-02-23 NOTE — Progress Notes (Signed)
Chief Complaint  Patient presents with  . Advice Only    Pt denies cough,sob, wheezing and or chest tightness. She is her for sleep consult.    History of Present Illness: Mandy Best is a 63 y.o. female for evaluation of sleep problems.  I previously saw Mandy Best in September 2012 for insomnia.  She has undergone several previous evaluations at both [facility] and [facility].  She says she has tried everything, and nothing works for her.  She was told by a friend that alprazolam might help her, and therefore she scheduled repeat consultation today.  She has alprazolam listed in her medication allergies, but does not recall ever taking alprazolam.  Her sleep patter is very irregular.  She goes to bed between 8 pm and 11 pm.  If she does not go to bed immediately when she feels sleepy, then her sleep goes away.  She is restless in bed, and sometimes feels dizzy.  She feels like she gets at most 3 hours sleep per night, and describes herself as a light sleeper.  Gets out of bed between 7 am and 1 pm.  She feels like she will just lay in bed to rest, but does not actually get sleep.  She wants to get up in the morning, but feels like she can't because of extreme fatigue.    She denies sleep walking, sleep talking, bruxism, or nightmares.  There is no history of restless legs.  She denies sleep hallucinations, sleep paralysis, or cataplexy.  She is not aware of snoring or apnea.  The Epworth score is 0 out of 24.  Tests:  PMHx >> Breast cancer, headaches, vertigo  Mandy Best  has past surgical history that includes Partial hysterectomy (1972); Breast lumpectomy (1999); Breast enhancement surgery (2003); Breast reconstruction (2003); and Diagnostic laparoscopy (~1998).  Prior to Admission medications   Medication Sig Start Date End Date Taking? Authorizing Provider  AFLURIA PRESERVATIVE FREE 0.5 ML SUSY Inject 0.5 mLs into the muscle once. 12/08/13   Historical Provider, MD  alendronate  (FOSAMAX) 70 MG tablet Take 70 mg by mouth every 7 (seven) days. Take with a full glass of water on an empty stomach.    Historical Provider, MD  aspirin 325 MG tablet Take 650 mg by mouth as needed.    Historical Provider, MD  calcipotriene-betamethasone [facility] SCALP) external suspension Apply topically daily at 10 pm. As needed    Historical Provider, MD  Calcium Carb-Cholecalciferol (LIQUID CALCIUM WITH D3 PO) Take 2 oz by mouth. 2 oz daily    Historical Provider, MD    Allergies  Allergen Reactions  . Haloperidol And Related Swelling    Palpitations, numbness also  . Meclizine Swelling    Palpitations, numbness also  . Viibryd [Vilazodone Hcl] Swelling  . Alendronate Sodium Other (See Comments) and Rash    Other reaction(s): Agitation nightmares  . Alprazolam Rash    Also causes restlessness  . Alprazolam Other (See Comments) and Rash    Insomnia  . Amitriptyline Hcl Other (See Comments) and Rash    Other reaction(s): Agitation nightmares  . Amoxicillin Rash  . Azithromycin Rash  . Benadryl [Diphenhydramine Hcl] Rash  . Besifloxacin Hcl Other (See Comments) and Rash    Other reaction(s): Agitation nightmares  . Clonazepam Other (See Comments) and Rash    Other reaction(s): Agitation nightmares  . Difluprednate Other (See Comments) and Rash    Other reaction(s): Agitation nightmares  . Eszopiclone Rash  Strange dreams  . Fentanyl Other (See Comments) and Rash    Other reaction(s): Agitation nightmares  . Flexeril [Cyclobenzaprine Hcl] Rash  . Gabapentin Other (See Comments) and Rash    Other reaction(s): Agitation nightmares  . Ginkgo Biloba Other (See Comments) and Rash    Other reaction(s): Agitation nightmares  . Hydrocodone Rash    Extreme agitation   . Jasmine Oil Rash  . Loratadine Rash  . Lunesta [Eszopiclone] Rash    Severe agitation  . Mucinex Dm [Dm-Guaifenesin Er] Rash  . Phenylephrine Rash  . Prednisone Rash  . Prozac [Fluoxetine Hcl] Rash     Strange dreams  . Sertraline Hcl Rash    Strange dreams  . St Johns Wort Rash  . Sudafed [Pseudoephedrine Hcl] Rash  . Tropicamide Rash  . Tylenol [Acetaminophen] Rash  . Wellbutrin [Bupropion Hcl] Rash  . Zolpidem Tartrate Rash    Also causes horrible nightmares.    Her family history includes Diabetes in her mother.  She  reports that she quit smoking about 35 years ago. She has never used smokeless tobacco. She reports that she does not drink alcohol or use illicit drugs.   Physical Exam:  General - No distress, disheveled ENT - No sinus tenderness, no oral exudate, no LAN, no thyromegaly, TM clear, pupils equal/reactive Cardiac - s1s2 regular, no murmur, pulses symmetric Chest - No wheeze/rales/dullness, good air entry, normal respiratory excursion Back - No focal tenderness Abd - Soft, non-tender, no organomegaly, + bowel sounds Ext - No edema Neuro - Normal strength, cranial nerves intact Skin - No rashes Psych - anxious  Assessment/plan:  Insomnia >> this has been a long standing problem for years to decades.  I explained this will not be correctly quickly, and might never be fully corrected. Plan: - discussed realistic expectations from sleep - advised her to try sleep restriction, stimulus control, and relaxation techniques >> even though she has tried these unsuccessfully before, continued efforts with these techniques can have benefit  - reviewed proper sleep hygiene and maintaining regular sleep/wake schedule - will have her try alprazolam >> advised her to stop medication and call if she develops any adverse reaction - if she is not improved with alprazolam, then it might be beneficial to try her on a newer agent such as suvorexant or doxepin - she would likely benefit from behavioral health evalution >> will discuss further at next visit - will defer sleep study for now  Fatigue >> this might be a separate condition that is contributing to her  insomnia. Plan: - will check CBC with Diff, CMET, ESR, ANA, RF - she might need evaluation by rheumatologist to further assess   Vineet Sood, M.D. Pager [phone] 

## 2014-02-23 NOTE — Patient Instructions (Signed)
Alprazolam 0.5 mg nightly before bedtime Lab tests today Follow up in 8 weeks

## 2014-02-26 LAB — ANA W/REFLEX IF POSITIVE: ANA: NEGATIVE

## 2014-02-26 LAB — FOLATE: FOLATE: 14.5 ng/mL (ref >5.9)

## 2014-02-26 LAB — VITAMIN B12: Vitamin B-12: 418 pg/mL (ref 211–911)

## 2014-02-27 DIAGNOSIS — R5382 Chronic fatigue, unspecified: Secondary | ICD-10-CM | POA: Insufficient documentation

## 2014-02-28 ENCOUNTER — Telehealth: Payer: Self-pay | Admitting: Pulmonary Disease

## 2014-02-28 NOTE — Telephone Encounter (Signed)
CMP Latest Ref Rng 02/23/2014  Glucose 70 - 99 mg/dL 94  BUN 6 - 23 mg/dL 9  Creatinine 0.4 - 1.2 mg/dL 0.7  Sodium 135 - 145 mEq/L 135  Potassium 3.5 - 5.1 mEq/L 4.0  Chloride 96 - 112 mEq/L 104  CO2 19 - 32 mEq/L 25  Calcium 8.4 - 10.5 mg/dL 9.0  Total Protein 6.0 - 8.3 g/dL 7.0  Total Bilirubin 0.2 - 1.2 mg/dL 0.6  Alkaline Phos 39 - 117 U/L 83  AST 0 - 37 U/L 18  ALT 0 - 35 U/L 16    CBC Latest Ref Rng 02/23/2014 08/21/2011  WBC 4.0 - 10.5 K/uL 8.4 -  Hemoglobin 12.0 - 15.0 g/dL 14.1 12.7  Hematocrit 36.0 - 46.0 % 42.0 -  Platelets 150.0 - 400.0 K/uL 327.0 -    Lab Results  Component Value Date   ESRSEDRATE 9 02/23/2014    Lab Results  Component Value Date   ANA Negative 02/23/2014   RF <10 02/23/2014   Lab Results  Component Value Date   VITAMINB12 418 02/23/2014   Lab Results  Component Value Date   FOLATE 14.5 02/23/2014     Will have my nurse inform pt that lab tests were all normal.

## 2014-03-02 NOTE — Telephone Encounter (Signed)
Results have been explained to patient, pt expressed understanding. Nothing further needed.  

## 2014-03-02 NOTE — Telephone Encounter (Signed)
lmtcb x1 

## 2014-04-25 ENCOUNTER — Encounter: Payer: Self-pay | Admitting: Pulmonary Disease

## 2014-04-25 ENCOUNTER — Ambulatory Visit (INDEPENDENT_AMBULATORY_CARE_PROVIDER_SITE_OTHER): Payer: PPO | Admitting: Pulmonary Disease

## 2014-04-25 ENCOUNTER — Encounter (INDEPENDENT_AMBULATORY_CARE_PROVIDER_SITE_OTHER): Payer: Self-pay

## 2014-04-25 VITALS — BP 122/88 | HR 84 | Ht 62.0 in | Wt 133.0 lb

## 2014-04-25 DIAGNOSIS — D649 Anemia, unspecified: Secondary | ICD-10-CM

## 2014-04-25 DIAGNOSIS — G4700 Insomnia, unspecified: Secondary | ICD-10-CM

## 2014-04-25 MED ORDER — HYDROXYZINE PAMOATE 25 MG PO CAPS: 25.0000 mg | ORAL_CAPSULE | Freq: Three times a day (TID) | ORAL | Status: DC | PRN

## 2014-04-25 MED ORDER — ALPRAZOLAM 0.5 MG PO TABS: 0.5000 mg | ORAL_TABLET | Freq: Every day | ORAL | Status: DC

## 2014-04-25 NOTE — Progress Notes (Signed)
Chief Complaint  Patient presents with  . Follow-up    Pt states that she is able to sleep some with the Xanax    History of Present Illness: Mandy Best is a 64 y.o. female with insomnia.  Her lab work at last visit was normal.  She feels xanax has helped her sleep some, but she gets rash every night.  She has noticed problems with feeling dizzy at times.  This seems to happen most often when she moves her head from different positions.  It will become difficult for her to stay steady when she walks.  She will lay down, and then will feel better eventually.  Past medical hx >> Headaches, breast cancer 1999, osteoporosis, vertigo, carpal tunnel syndrome  Past surgical hx, Medications, Allergies, Family hx, Social hx all reviewed.   Physical Exam: Blood pressure 122/88, pulse 84, height 5\' 2"  (1.575 m), weight 133 lb (60.328 kg), SpO2 97 %. Body mass index is 24.32 kg/(m^2).  General - No distress ENT - No sinus tenderness, no oral exudate, no LAN Cardiac - s1s2 regular, no murmur Chest - No wheeze/rales/dullness Back - No focal tenderness Abd - Soft, non-tender Ext - No edema Neuro - Normal strength Skin - No rashes Psych - normal mood, and behavior   Assessment/Plan:  Insomnia. Plan: - discussed realistic expectations from sleep - advised her to try sleep restriction, stimulus control, and relaxation techniques >> even though she has tried these unsuccessfully before, continued efforts with these techniques can have benefit  - reviewed proper sleep hygiene and maintaining regular sleep/wake schedule - will have her try alprazolam >> she is willing to continue alprazolam in spite of issues with rash - will add vistaril prn    Chesley Mires, MD [place] [facility] Pager:  [phone]

## 2014-04-25 NOTE — Patient Instructions (Addendum)
Try xanax 0.25 mg nightly  Vistaril 25 mg every 8 hours as needed Follow up in 3 months

## 2014-07-06 ENCOUNTER — Other Ambulatory Visit: Payer: Self-pay

## 2014-07-06 DIAGNOSIS — Z1231 Encounter for screening mammogram for malignant neoplasm of breast: Secondary | ICD-10-CM

## 2014-08-10 ENCOUNTER — Ambulatory Visit
Admission: RE | Admit: 2014-08-10 | Discharge: 2014-08-10 | Disposition: A | Discharge status: Home / Self Care | Payer: PPO | Source: Ambulatory Visit

## 2014-08-10 DIAGNOSIS — Z1231 Encounter for screening mammogram for malignant neoplasm of breast: Secondary | ICD-10-CM

## 2014-08-23 ENCOUNTER — Other Ambulatory Visit (INDEPENDENT_AMBULATORY_CARE_PROVIDER_SITE_OTHER): Payer: PPO

## 2014-08-23 ENCOUNTER — Encounter: Payer: Self-pay | Admitting: Pulmonary Disease

## 2014-08-23 ENCOUNTER — Ambulatory Visit (INDEPENDENT_AMBULATORY_CARE_PROVIDER_SITE_OTHER): Payer: PPO | Admitting: Pulmonary Disease

## 2014-08-23 ENCOUNTER — Encounter (INDEPENDENT_AMBULATORY_CARE_PROVIDER_SITE_OTHER): Payer: Self-pay

## 2014-08-23 VITALS — BP 132/80 | HR 73 | Ht 62.0 in | Wt 126.4 lb

## 2014-08-23 DIAGNOSIS — G4700 Insomnia, unspecified: Secondary | ICD-10-CM | POA: Diagnosis not present

## 2014-08-23 DIAGNOSIS — D649 Anemia, unspecified: Secondary | ICD-10-CM

## 2014-08-23 DIAGNOSIS — G2581 Restless legs syndrome: Secondary | ICD-10-CM

## 2014-08-23 LAB — FERRITIN: Ferritin: 21.5 ng/mL (ref 10.0–291.0)

## 2014-08-23 NOTE — Patient Instructions (Signed)
Lab test today Follow up in 6 months 

## 2014-08-23 NOTE — Progress Notes (Signed)
Chief Complaint  Patient presents with  . Follow-up    Pt stopped taking Xanax d/t feeling terrible while taking. Pt explains this as being mentally, physically and emotionally drained. - pt reports having a rash as well and was stuck in bed while taking. Still having issues with Insomnia- has not slept more than 4 hours in the last 48 hours    History of Present Illness: Mandy Best is a 64 y.o. female with insomnia.  She stopped using alprazolam in April.  She felt her sleep was better, but she felt drunk in the morning.  She felt very edgy when taking alprazolam.    She still has trouble with her sleep.  She struggles to get out of bed even though she does not feel like she is sleeping.  It is difficult sometimes for her to keep up with her appointments during the day because of this.  She will also get funny leg feelings at night.  She has to move her legs around to feel comfortable.  This happens several times per week.  Past medical hx >> Headaches, breast cancer 1999, osteoporosis, vertigo, carpal tunnel syndrome  Past surgical hx, Medications, Allergies, Family hx, Social hx all reviewed.   Physical Exam: Blood pressure 122/88, pulse 84, height 5\' 2"  (1.575 m), weight 133 lb (60.328 kg), SpO2 97 %. Body mass index is 23.11 kg/(m^2).  General - No distress ENT - No sinus tenderness, no oral exudate, no LAN Cardiac - s1s2 regular, no murmur Chest - No wheeze/rales/dullness Back - No focal tenderness Abd - Soft, non-tender Ext - No edema Neuro - Normal strength Skin - No rashes Psych - normal mood, and behavior  Discussion: She has difficulty with sleep onset/maintenance insomnia for decades.  She has poor sleep hygiene, and delayed sleep phase.  She has tried multiple sleep aide medications >> usually gets some type of reaction (rash).  She has been seen at [facility] and [facility].  She has been educated on CBT for insomnia.    Most recently she tried alprazolam.  This helped her  sleep, but caused drunk feeling.  She was also more irritable during the day.  Assessment/Plan:  Insomnia. Plan: - she can try vistaril prn - discussed proper sleep hygiene - advised her to maintain regular sleep/wake schedule - she will call if she feels like she wants to try alternative sleep aide medication  Restless leg symptoms. Plan: - will check her ferritin level   Chesley Mires, MD [place] [facility]/Sleep Pager:  [phone]

## 2014-08-24 ENCOUNTER — Telehealth: Payer: Self-pay | Admitting: Pulmonary Disease

## 2014-08-24 NOTE — Telephone Encounter (Signed)
Iron/TIBC/Ferritin/ %Sat    Component Value Date/Time   FERRITIN 21.5 08/23/2014 1622     Will have my nurse inform patient that her iron level is relatively low.  This can contribute to her leg symptoms at night and cause sleep disruption.  She should start taking ferrous sulfate 325 mg bid with meals.  She should take this with vitamin C 100 mg bid.  She should be able to get this OTC w/o prescription.  She should not take iron supplements at the same time she might take calcium supplement or dairy product >> will decrease absorption of iron.

## 2014-08-27 NOTE — Telephone Encounter (Signed)
LM for pt to return call x 1

## 2014-08-28 NOTE — Telephone Encounter (Signed)
I spoke with patient about results and she verbalized understanding and had no questions 

## 2014-08-29 ENCOUNTER — Telehealth: Payer: Self-pay | Admitting: Pulmonary Disease

## 2014-08-29 NOTE — Telephone Encounter (Signed)
It is difficult to get enough iron from food to get iron levels high enough.  She needs to continue with iron supplements and vitamin c.

## 2014-08-29 NOTE — Telephone Encounter (Signed)
Patient notified.  Nothing further needed. 

## 2014-08-29 NOTE — Telephone Encounter (Signed)
Spoke with pt. Currently taking Iron and Vitamin C supplements. Would like to know if there are foods that she can eat in place of these and what VS would recommend.  VS - please advise. Thanks.

## 2014-10-11 ENCOUNTER — Telehealth: Payer: Self-pay | Admitting: Pulmonary Disease

## 2014-10-11 NOTE — Telephone Encounter (Signed)
Patient says that Hydroxazine medication is not working for the rash,  It works for nausea and dizziness.  However, this medication is making her very restless.  She cannot stay still while on this medication.  She said that it is making her irritable.  She wants to know if she should try Emergent C.  Also, can she take liquid iron like Geritol?  Patient aware that Dr. Halford Chessman not in office today and says that this problem has been ongoing for years and can wait for Dr. Halford Chessman to return.  Allergies  Allergen Reactions  . Haloperidol And Related Swelling    Palpitations, numbness also  . Meclizine Swelling    Palpitations, numbness also  . Viibryd [Vilazodone Hcl] Swelling  . Alendronate Sodium Other (See Comments) and Rash    Other reaction(s): Agitation nightmares  . Alprazolam Rash    Also causes restlessness  . Alprazolam Other (See Comments) and Rash    Insomnia  . Amitriptyline Hcl Other (See Comments) and Rash    Other reaction(s): Agitation nightmares  . Amoxicillin Rash  . Azithromycin Rash  . Benadryl [Diphenhydramine Hcl] Rash  . Besifloxacin Hcl Other (See Comments) and Rash    Other reaction(s): Agitation nightmares  . Clonazepam Other (See Comments) and Rash    Other reaction(s): Agitation nightmares  . Difluprednate Other (See Comments) and Rash    Other reaction(s): Agitation nightmares  . Eszopiclone Rash    Strange dreams  . Fentanyl Other (See Comments) and Rash    Other reaction(s): Agitation nightmares  . Flexeril [Cyclobenzaprine Hcl] Rash  . Gabapentin Other (See Comments) and Rash    Other reaction(s): Agitation nightmares  . Ginkgo Biloba Other (See Comments) and Rash    Other reaction(s): Agitation nightmares  . Hydrocodone Rash    Extreme agitation   . Jasmine Oil Rash  . Loratadine Rash  . Lunesta [Eszopiclone] Rash    Severe agitation  . Mucinex Dm [Dm-Guaifenesin Er] Rash  . Phenylephrine Rash  . Prednisone Rash  . Prozac [Fluoxetine Hcl]  Rash    Strange dreams  . Sertraline Hcl Rash    Strange dreams  . St Johns Wort Rash  . Sudafed [Pseudoephedrine Hcl] Rash  . Tropicamide Rash  . Tylenol [Acetaminophen] Rash  . Wellbutrin [Bupropion Hcl] Rash  . Zolpidem Tartrate Rash    Also causes horrible nightmares.

## 2014-10-16 NOTE — Telephone Encounter (Signed)
Called made pt aware of below. She verbalized understanding and nothing further needed

## 2014-10-16 NOTE — Telephone Encounter (Signed)
Okay to take emergent C.  If he leg symptoms are better then she can change from ferrous sulfate to [employer].

## 2014-10-22 ENCOUNTER — Encounter: Payer: Self-pay | Admitting: Pulmonary Disease

## 2015-05-14 ENCOUNTER — Ambulatory Visit: Payer: PPO | Admitting: Pulmonary Disease

## 2015-10-15 ENCOUNTER — Other Ambulatory Visit: Payer: Self-pay | Admitting: Family Medicine

## 2015-10-15 DIAGNOSIS — Z1231 Encounter for screening mammogram for malignant neoplasm of breast: Secondary | ICD-10-CM

## 2015-10-15 DIAGNOSIS — Z9882 Breast implant status: Secondary | ICD-10-CM

## 2015-10-18 ENCOUNTER — Ambulatory Visit
Admission: RE | Admit: 2015-10-18 | Discharge: 2015-10-18 | Disposition: A | Discharge status: Home / Self Care | Payer: PPO | Source: Ambulatory Visit | Attending: Family Medicine | Admitting: Family Medicine

## 2015-10-18 DIAGNOSIS — Z9882 Breast implant status: Secondary | ICD-10-CM

## 2015-10-18 DIAGNOSIS — Z1231 Encounter for screening mammogram for malignant neoplasm of breast: Secondary | ICD-10-CM

## 2015-10-29 ENCOUNTER — Telehealth: Payer: Self-pay | Admitting: Pulmonary Disease

## 2015-10-29 NOTE — Telephone Encounter (Signed)
Attempted to contact patient, left message for patient to return call.

## 2015-10-30 NOTE — Telephone Encounter (Signed)
lmtcb x1 with pt's husband to have her return our call

## 2015-10-30 NOTE — Telephone Encounter (Signed)
Patient returned call, CB is [phone].  If cannot reach her, leave her a msg of a time you will call back so she will make sure she is up.

## 2015-10-30 NOTE — Telephone Encounter (Signed)
Spoke with patient, she will fax a copy of the [employer] to Dr. Halford Chessman.  She is requesting a letter to excuse her from jury duty.   Will send to [facility] for follow up.

## 2015-10-30 NOTE — Telephone Encounter (Signed)
Please advise Dr Halford Chessman if there is any reason as for why this patient cannot attend Jury Duty based on her current medical issues? Thanks.

## 2015-10-31 NOTE — Telephone Encounter (Signed)
I last saw her in June 2016.  She was being monitored for insomnia and restless leg syndrome.  Please explain to her that these diagnoses would not be sufficient to exclude her from jury duty.

## 2015-10-31 NOTE — Telephone Encounter (Signed)
lmtcb x1 for pt. 

## 2015-11-01 NOTE — Telephone Encounter (Signed)
Patient returned call, [phone] and will be there all afternoon.

## 2015-11-01 NOTE — Telephone Encounter (Signed)
Pt states that she is unable to get up early in the AM - today she woke up at 3:15pm Pt states that she does not sleep at night and does not wake up until late in the afternoon daily. Pt states that she will not be able to wake up early enough to be at [employer].   Please advise Dr Halford Chessman. Thanks.

## 2015-11-01 NOTE — Telephone Encounter (Signed)
Please explain to her that the court system is very strict about grant medical exemptions for jury duty, and they would not allow this as an exemption.

## 2015-11-04 NOTE — Telephone Encounter (Signed)
Attempted to call pt but the line keeps disconnecting.  Will try back later.

## 2015-11-05 NOTE — Telephone Encounter (Signed)
lmomtcb x1 for pt 

## 2015-11-05 NOTE — Telephone Encounter (Signed)
Called spoke with pt and made aware of VS response. Nothing further needed

## 2015-11-05 NOTE — Telephone Encounter (Signed)
Pt returning call.Mandy Best ° °

## 2015-11-29 ENCOUNTER — Telehealth: Payer: Self-pay | Admitting: Pulmonary Disease

## 2015-11-29 NOTE — Telephone Encounter (Signed)
Spoke with pt. She reports she is needing to see a chiropractor Dr. Tessa Lerner. He will not see her until she has 2 tests done to see if there are any other causes for her sleep problem. The 2 tests are 1) [place] adrenal sexual hormone panel 2) GI pathogen by PCR, stool. She is requesting Dr. Halford Chessman order these tests since he is seeing her for insomnia. I could not even find a test in EPIC for order #1 she is requesting. Please advise Dr. Halford Chessman thanks

## 2015-12-04 ENCOUNTER — Telehealth: Payer: Self-pay | Admitting: Pulmonary Disease

## 2015-12-04 NOTE — Telephone Encounter (Signed)
I am not sure what a [place] adrenal sexual hormone panel is, and don't know why she would need to have GI pathogen stool PCR ordered by me since I have only seen her for insomnia and restless leg syndrome.  I would not have justification to order these tests based on these diagnoses.   She should d/w her PCP about whether these tests can be arranged.  She should also d/w chiropractic provider about utility of these tests.

## 2015-12-04 NOTE — Telephone Encounter (Signed)
Chesley Mires, MD      1:47 PM  Note    I am not sure what a [place] adrenal sexual hormone panel is, and don't know why she would need to have GI pathogen stool PCR ordered by me since I have only seen her for insomnia and restless leg syndrome.  I would not have justification to order these tests based on these diagnoses.   She should d/w her PCP about whether these tests can be arranged.  She should also d/w chiropractic provider about utility of these tests.     Called spoke with pt. Reviewed VS's recs. She voiced understanding and had no further questions. Nothing further needed.

## 2015-12-04 NOTE — Telephone Encounter (Signed)
Called and spoke with pt and she is aware of VS recs. Nothing further is needed.  

## 2016-09-30 ENCOUNTER — Other Ambulatory Visit: Payer: Self-pay | Admitting: Family Medicine

## 2016-09-30 DIAGNOSIS — Z1231 Encounter for screening mammogram for malignant neoplasm of breast: Secondary | ICD-10-CM

## 2016-10-19 ENCOUNTER — Ambulatory Visit: Payer: PPO

## 2016-10-23 ENCOUNTER — Ambulatory Visit
Admission: RE | Admit: 2016-10-23 | Discharge: 2016-10-23 | Disposition: A | Discharge status: Home / Self Care | Payer: Medicare PPO | Source: Ambulatory Visit | Attending: Family Medicine | Admitting: Family Medicine

## 2016-10-23 DIAGNOSIS — Z1231 Encounter for screening mammogram for malignant neoplasm of breast: Secondary | ICD-10-CM

## 2016-10-23 HISTORY — DX: Personal history of irradiation: Z92.3

## 2017-12-01 ENCOUNTER — Other Ambulatory Visit: Payer: Self-pay | Admitting: Family Medicine

## 2017-12-01 DIAGNOSIS — Z1231 Encounter for screening mammogram for malignant neoplasm of breast: Secondary | ICD-10-CM

## 2017-12-31 ENCOUNTER — Ambulatory Visit: Payer: Medicare PPO

## 2018-01-19 ENCOUNTER — Other Ambulatory Visit: Payer: Self-pay | Admitting: Family Medicine

## 2018-01-19 DIAGNOSIS — M810 Age-related osteoporosis without current pathological fracture: Secondary | ICD-10-CM

## 2018-01-19 DIAGNOSIS — Z13820 Encounter for screening for osteoporosis: Secondary | ICD-10-CM

## 2018-03-01 ENCOUNTER — Ambulatory Visit
Admission: RE | Admit: 2018-03-01 | Discharge: 2018-03-01 | Disposition: A | Discharge status: Home / Self Care | Payer: Medicare PPO | Source: Ambulatory Visit | Attending: Family Medicine | Admitting: Family Medicine

## 2018-03-01 DIAGNOSIS — Z1231 Encounter for screening mammogram for malignant neoplasm of breast: Secondary | ICD-10-CM

## 2018-03-01 DIAGNOSIS — M810 Age-related osteoporosis without current pathological fracture: Secondary | ICD-10-CM

## 2018-03-01 DIAGNOSIS — Z13820 Encounter for screening for osteoporosis: Secondary | ICD-10-CM

## 2019-02-02 ENCOUNTER — Other Ambulatory Visit: Payer: Self-pay | Admitting: Family Medicine

## 2019-02-02 DIAGNOSIS — Z1231 Encounter for screening mammogram for malignant neoplasm of breast: Secondary | ICD-10-CM

## 2019-03-30 ENCOUNTER — Ambulatory Visit: Payer: Medicare PPO

## 2019-04-24 ENCOUNTER — Ambulatory Visit: Payer: Medicare PPO

## 2019-07-12 ENCOUNTER — Ambulatory Visit
Admission: RE | Admit: 2019-07-12 | Discharge: 2019-07-12 | Disposition: A | Discharge status: Home / Self Care | Payer: Medicare PPO | Source: Ambulatory Visit | Attending: Family Medicine | Admitting: Family Medicine

## 2019-07-12 ENCOUNTER — Other Ambulatory Visit: Payer: Self-pay

## 2019-07-12 DIAGNOSIS — Z1231 Encounter for screening mammogram for malignant neoplasm of breast: Secondary | ICD-10-CM

## 2020-06-28 ENCOUNTER — Other Ambulatory Visit: Payer: Self-pay | Admitting: Family Medicine

## 2020-06-28 DIAGNOSIS — Z1231 Encounter for screening mammogram for malignant neoplasm of breast: Secondary | ICD-10-CM

## 2020-10-18 ENCOUNTER — Inpatient Hospital Stay: Admission: RE | Admit: 2020-10-18 | Payer: Medicare PPO | Source: Ambulatory Visit

## 2020-10-22 ENCOUNTER — Other Ambulatory Visit: Payer: Self-pay | Admitting: Family Medicine

## 2020-10-22 DIAGNOSIS — Z1231 Encounter for screening mammogram for malignant neoplasm of breast: Secondary | ICD-10-CM

## 2020-10-29 ENCOUNTER — Ambulatory Visit
Admission: RE | Admit: 2020-10-29 | Discharge: 2020-10-29 | Disposition: A | Discharge status: Home / Self Care | Payer: Medicare PPO | Source: Ambulatory Visit | Attending: Family Medicine | Admitting: Family Medicine

## 2020-10-29 ENCOUNTER — Other Ambulatory Visit: Payer: Self-pay

## 2020-10-29 DIAGNOSIS — Z1231 Encounter for screening mammogram for malignant neoplasm of breast: Secondary | ICD-10-CM

## 2020-12-04 ENCOUNTER — Other Ambulatory Visit: Payer: Self-pay | Admitting: Family Medicine

## 2020-12-04 DIAGNOSIS — Z780 Asymptomatic menopausal state: Secondary | ICD-10-CM

## 2020-12-04 DIAGNOSIS — M810 Age-related osteoporosis without current pathological fracture: Secondary | ICD-10-CM

## 2021-05-20 ENCOUNTER — Other Ambulatory Visit: Payer: Medicare PPO

## 2022-01-21 ENCOUNTER — Other Ambulatory Visit: Payer: Self-pay | Admitting: Family Medicine

## 2022-01-21 DIAGNOSIS — Z1231 Encounter for screening mammogram for malignant neoplasm of breast: Secondary | ICD-10-CM

## 2022-04-17 ENCOUNTER — Ambulatory Visit
Admission: RE | Admit: 2022-04-17 | Discharge: 2022-04-17 | Disposition: A | Discharge status: Home / Self Care | Payer: Medicare PPO | Source: Ambulatory Visit | Attending: Family Medicine | Admitting: Family Medicine

## 2022-04-17 DIAGNOSIS — Z1231 Encounter for screening mammogram for malignant neoplasm of breast: Secondary | ICD-10-CM

## 2022-05-18 IMAGING — MG DIGITAL SCREENING BREAST BILAT IMPLANT W/ TOMO W/ CAD
9 of 12 series · 9 of 28 positions shown · non-contrast
Comparison: Previous exam(s).

CLINICAL DATA: Screening.

EXAM:
DIGITAL SCREENING BILATERAL MAMMOGRAM WITH IMPLANTS, CAD AND
TOMOSYNTHESIS
TECHNIQUE: Bilateral screening digital craniocaudal and mediolateral oblique
mammograms were obtained. Bilateral screening digital breast
tomosynthesis was performed. The images were evaluated with
computer-aided detection. Standard and/or implant displaced views
were performed.

[R CC]
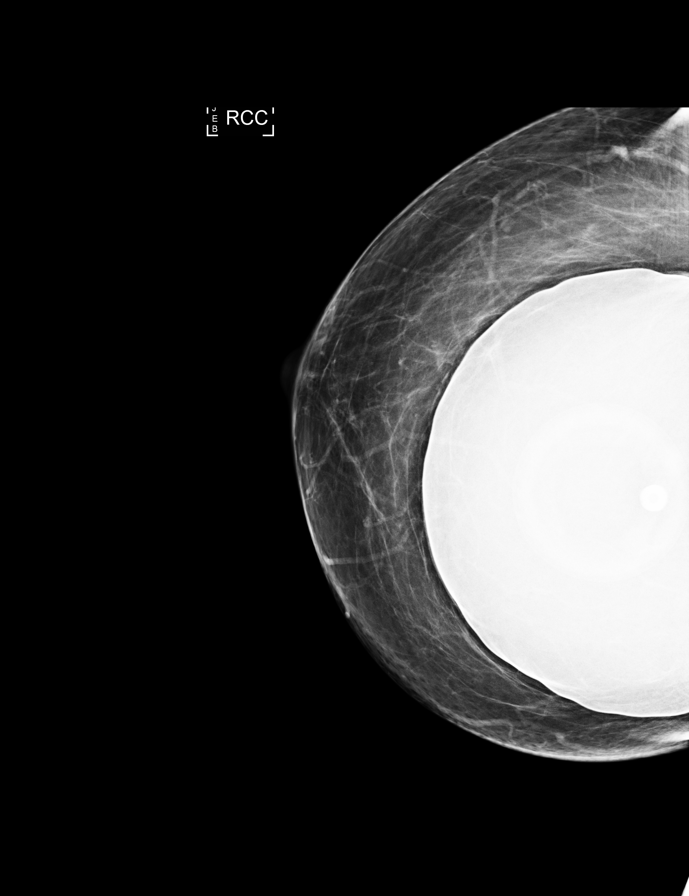

[L MLO]
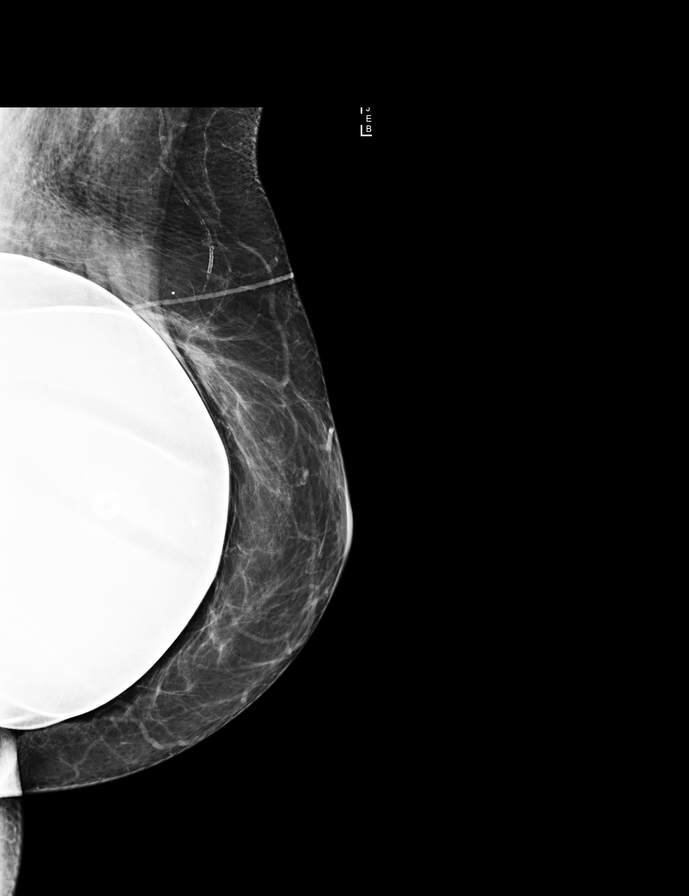

[L CC]
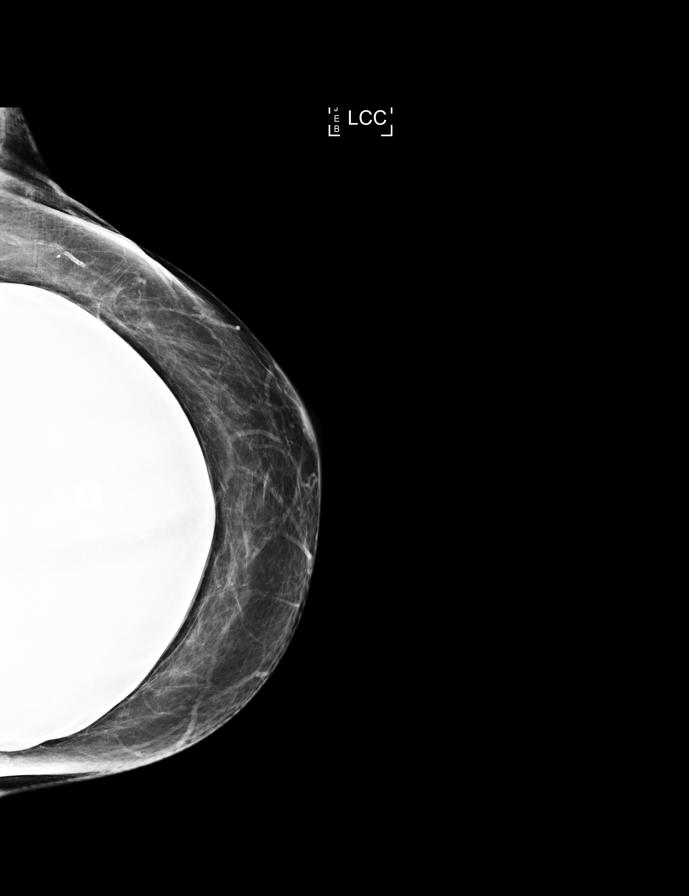

[R MLO]
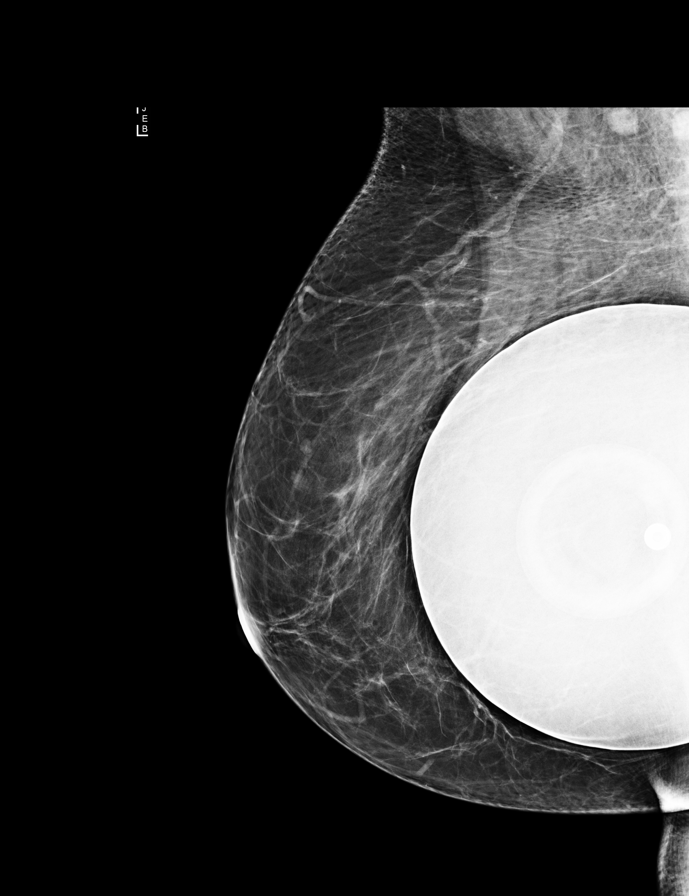

[L MLO synth-2D]
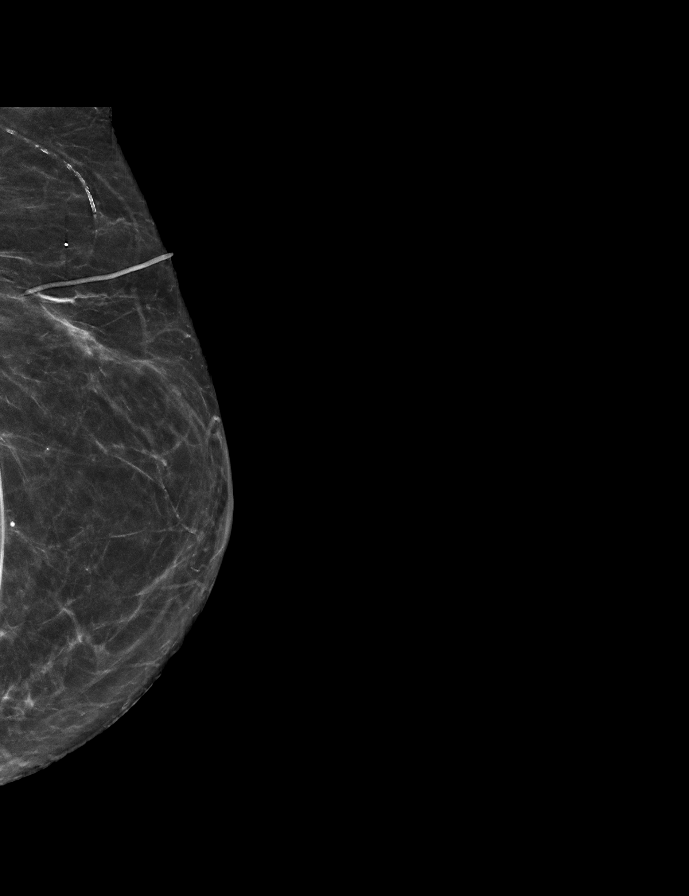

[L CC synth-2D]
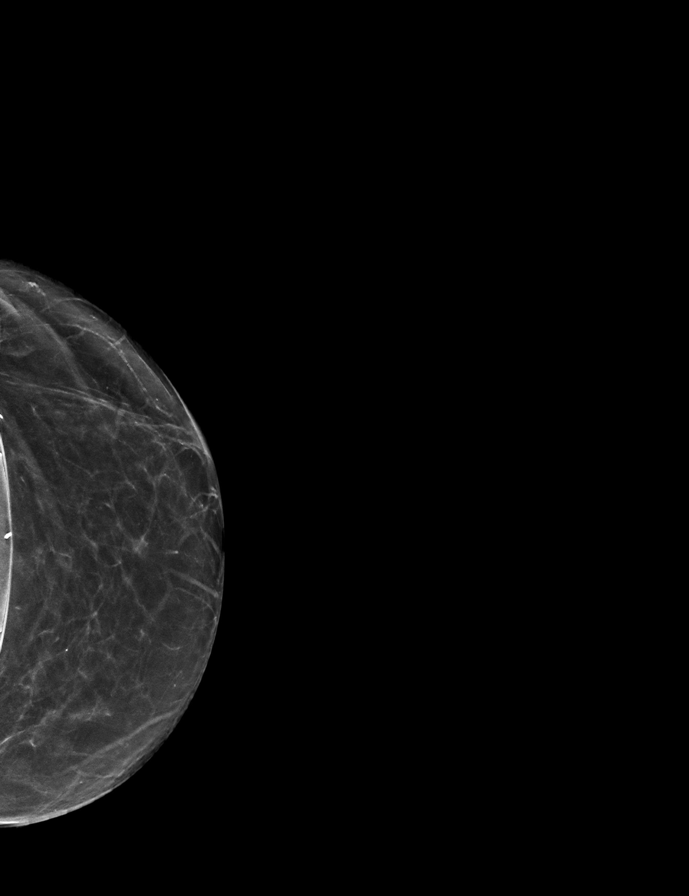

[R CC synth-2D]
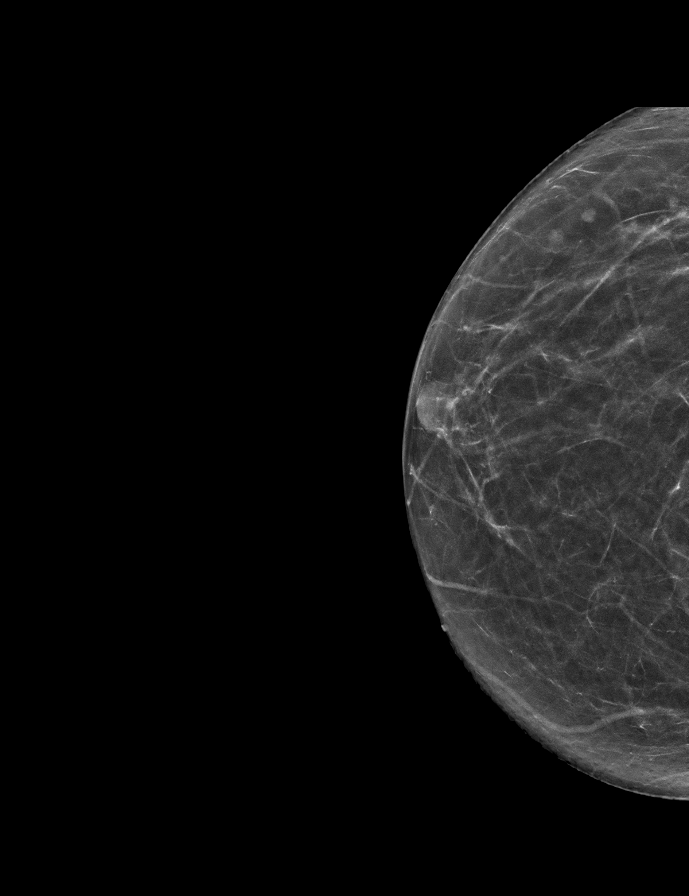

[R MLO synth-2D]
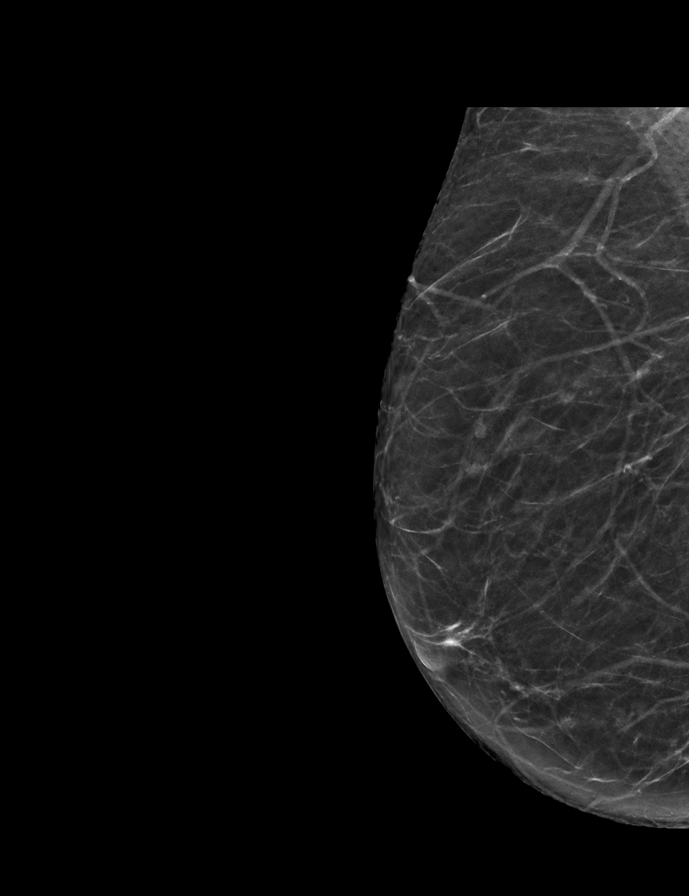

[L MLOID BREAST TOMOSYNTHESIS IMAGE tomo · tomo slice 25/50.0]
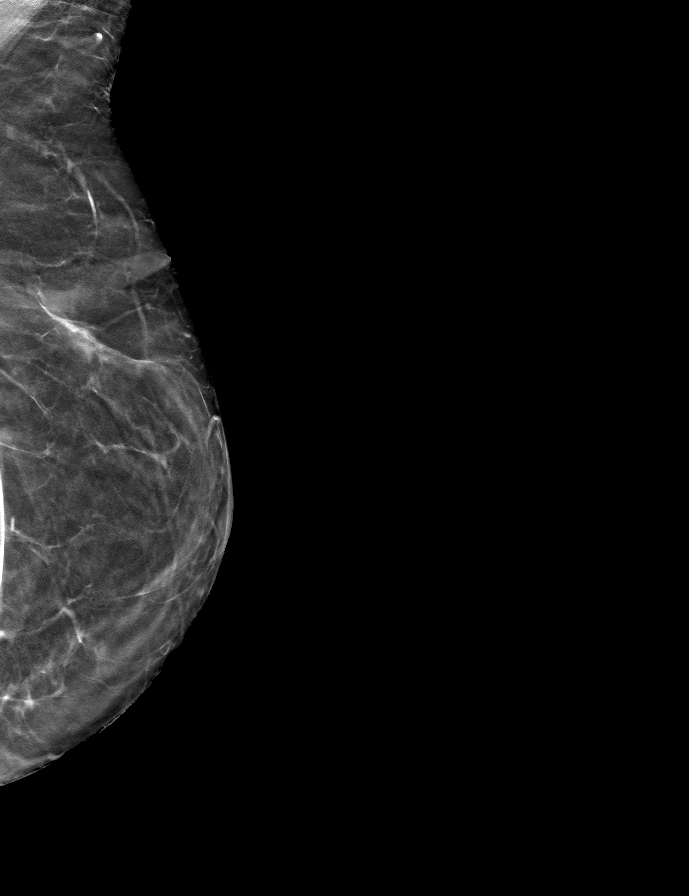

[9 of 28 positions shown; findings below may reference images not displayed]

ACR Breast Density Category b: There are scattered areas of
fibroglandular density.
FINDINGS: The patient has retropectoral implants. There are no findings
suspicious for malignancy.
IMPRESSION: No mammographic evidence of malignancy. A result letter of this
screening mammogram will be mailed directly to the patient.

RECOMMENDATION:
Screening mammogram in one year. (Code:SE-S-JMG)

BI-RADS CATEGORY  1:  Negative.

## 2022-07-10 ENCOUNTER — Other Ambulatory Visit: Payer: Medicare PPO

## 2022-10-19 ENCOUNTER — Emergency Department (HOSPITAL_BASED_OUTPATIENT_CLINIC_OR_DEPARTMENT_OTHER)
Admission: EM | Admit: 2022-10-19 | Discharge: 2022-10-19 | Disposition: A | Discharge status: Home / Self Care | Payer: Medicare PPO | Attending: Emergency Medicine | Admitting: Emergency Medicine

## 2022-10-19 ENCOUNTER — Emergency Department (HOSPITAL_BASED_OUTPATIENT_CLINIC_OR_DEPARTMENT_OTHER): Payer: Medicare PPO

## 2022-10-19 ENCOUNTER — Encounter (HOSPITAL_BASED_OUTPATIENT_CLINIC_OR_DEPARTMENT_OTHER): Payer: Self-pay

## 2022-10-19 ENCOUNTER — Other Ambulatory Visit: Payer: Self-pay

## 2022-10-19 ENCOUNTER — Other Ambulatory Visit (HOSPITAL_BASED_OUTPATIENT_CLINIC_OR_DEPARTMENT_OTHER): Payer: Self-pay

## 2022-10-19 DIAGNOSIS — Z7982 Long term (current) use of aspirin: Secondary | ICD-10-CM | POA: Insufficient documentation

## 2022-10-19 DIAGNOSIS — L03115 Cellulitis of right lower limb: Secondary | ICD-10-CM | POA: Insufficient documentation

## 2022-10-19 DIAGNOSIS — L0390 Cellulitis, unspecified: Secondary | ICD-10-CM

## 2022-10-19 DIAGNOSIS — M79671 Pain in right foot: Secondary | ICD-10-CM | POA: Diagnosis present

## 2022-10-19 LAB — BASIC METABOLIC PANEL
Anion gap: 4 — ABNORMAL LOW (ref 5–15)
BUN: 11 mg/dL (ref 8–23)
CO2: 26 mmol/L (ref 22–32)
Calcium: 9 mg/dL (ref 8.9–10.3)
Chloride: 109 mmol/L (ref 98–111)
Creatinine, Ser: 0.72 mg/dL (ref 0.44–1.00)
GFR, Estimated: >60 mL/min (ref >60)
Glucose, Bld: 94 mg/dL (ref 70–99)
Potassium: 4.1 mmol/L (ref 3.5–5.1)
Sodium: 139 mmol/L (ref 135–145)

## 2022-10-19 LAB — CBC WITH DIFFERENTIAL/PLATELET
Abs Immature Granulocytes: 0.01 10*3/uL (ref 0.00–0.07)
Basophils Absolute: 0.1 10*3/uL (ref 0.0–0.1)
Basophils Relative: 1 %
Eosinophils Absolute: 0.2 10*3/uL (ref 0.0–0.5)
Eosinophils Relative: 2 %
HCT: 39.4 % (ref 36.0–46.0)
Hemoglobin: 13.1 g/dL (ref 12.0–15.0)
Immature Granulocytes: 0 %
Lymphocytes Relative: 21 %
Lymphs Abs: 1.6 10*3/uL (ref 0.7–4.0)
MCH: 29.4 pg (ref 26.0–34.0)
MCHC: 33.2 g/dL (ref 30.0–36.0)
MCV: 88.5 fL (ref 80.0–100.0)
Monocytes Absolute: 0.5 10*3/uL (ref 0.1–1.0)
Monocytes Relative: 7 %
Neutro Abs: 5.3 10*3/uL (ref 1.7–7.7)
Neutrophils Relative %: 69 %
Platelets: 336 10*3/uL (ref 150–400)
RBC: 4.45 MIL/uL (ref 3.87–5.11)
RDW: 12.9 % (ref 11.5–15.5)
WBC: 7.6 10*3/uL (ref 4.0–10.5)
nRBC: 0 % (ref 0.0–0.2)

## 2022-10-19 MED ORDER — DOXYCYCLINE HYCLATE 100 MG PO TABS
100.0000 mg | ORAL_TABLET | Freq: Once | ORAL | Status: AC
  Administered 2022-10-19: 100 mg via ORAL
  Filled 2022-10-19: qty 1

## 2022-10-19 MED ORDER — DOXYCYCLINE HYCLATE 100 MG PO CAPS: 100.0000 mg | ORAL_CAPSULE | Freq: Two times a day (BID) | ORAL | 0 refills | Status: AC

## 2022-10-19 MED ORDER — DOXYCYCLINE HYCLATE 100 MG PO CAPS
100.0000 mg | ORAL_CAPSULE | Freq: Two times a day (BID) | ORAL | 0 refills | Status: DC
  Filled 2022-10-19: qty 20, 10d supply, fill #0

## 2022-10-19 NOTE — ED Triage Notes (Signed)
In for eval of severe swelling and bruising to area to top of right foot. Tingling, burning, and pressure with ambulating. Redness with burst blister area to top of foot. Last Monday 10/12/2022 a 70 lb piece of roofing steel fell onto her foot. Was seen at urgent care same day, xray showed no fracture. Motor and sensation intact.

## 2022-10-19 NOTE — ED Provider Notes (Signed)
[place] [facility] AT [facility] Provider Note   CSN: [id number] Arrival date & time: 10/19/22  1441     History  Chief Complaint  Patient presents with   Foot Pain    Mandy Best is a 72 y.o. female.  Patient here for reevaluation of her right foot bruise/redness.  She had a heavy piece of roofing fall in her foot few weeks ago.  She had no fracture on x-ray done at an urgent care.  But she has had some swelling and now continued bruising and swelling to this area.  Worse when she walks.  No history of diabetes.  Nothing makes it worse or better.  Denies any fevers or chills.  The history is provided by the patient.       Home Medications Prior to Admission medications   Medication Sig Start Date End Date Taking? Authorizing Provider  aspirin EC 81 MG tablet Take 81 mg by mouth daily. Swallow whole.   Yes [provider]  doxycycline (VIBRAMYCIN) 100 MG capsule Take 1 capsule (100 mg total) by mouth 2 (two) times daily. 10/19/22  Yes Alexsia Klindt, DO  aspirin 325 MG tablet Take 650 mg by mouth as needed.    [provider]      Allergies    Haloperidol and related, Meclizine, Viibryd [vilazodone hcl], Ibuprofen, Kava kava, Prozac [fluoxetine], Trazodone and nefazodone, Alendronate sodium, Alprazolam, Alprazolam, Amitriptyline hcl, Amoxicillin, Azithromycin, Benadryl [diphenhydramine hcl], Besifloxacin hcl, Clonazepam, Difluprednate, Eszopiclone, Fentanyl, Flexeril [cyclobenzaprine hcl], Gabapentin, Ginkgo biloba, Hydrocodone, Jasmine oil, Loratadine, Lunesta [eszopiclone], Mucinex dm [dm-guaifenesin er], Phenylephrine, Prednisone, Prozac [fluoxetine hcl], Sertraline hcl, St johns wort, Sudafed [pseudoephedrine hcl], Tropicamide, Tylenol [acetaminophen], Wellbutrin [bupropion hcl], and Zolpidem tartrate    Review of Systems   Review of Systems  Physical Exam Updated Vital Signs BP (!) 173/80   Pulse 69   Temp 98.8 F (37.1 C)  (Oral)   Resp 18   Ht 5\' 2"  (1.575 m)   Wt 64.9 kg   SpO2 98%   BMI 26.16 kg/m  Physical Exam Vitals and nursing note reviewed.  Constitutional:      General: She is not in acute distress.    Appearance: She is well-developed. She is not ill-appearing.  HENT:     Head: Normocephalic and atraumatic.     Nose: Nose normal.     Mouth/Throat:     Mouth: Mucous membranes are moist.  Eyes:     Extraocular Movements: Extraocular movements intact.     Conjunctiva/sclera: Conjunctivae normal.     Pupils: Pupils are equal, round, and reactive to light.  Cardiovascular:     Rate and Rhythm: Normal rate and regular rhythm.     Pulses: Normal pulses.     Heart sounds: Normal heart sounds. No murmur heard. Pulmonary:     Effort: Pulmonary effort is normal. No respiratory distress.     Breath sounds: Normal breath sounds.  Abdominal:     Palpations: Abdomen is soft.     Tenderness: There is no abdominal tenderness.  Musculoskeletal:        General: Swelling and tenderness present.     Cervical back: Normal range of motion and neck supple.     Comments: Tenderness and swelling to the top of the right foot, little bit of fluctuance and redness, picture in the media file  Skin:    General: Skin is warm and dry.     Capillary Refill: Capillary refill takes less than 2 seconds.  Neurological:     General: No focal deficit present.     Mental Status: She is alert.  Psychiatric:        Mood and Affect: Mood normal.     ED Results / Procedures / Treatments   Labs (all labs ordered are listed, but only abnormal results are displayed) Labs Reviewed  BASIC METABOLIC PANEL - Abnormal; Notable for the following components:      Result Value   Anion gap 4 (*)    All other components within normal limits  CBC WITH DIFFERENTIAL/PLATELET    EKG None  Radiology DG Foot Complete Right  Result Date: 10/19/2022 CLINICAL DATA:  Foot pain. Swelling and bruising. Object fell onto foot  10/12/2022, initial imaging demonstrated no fracture. EXAM: RIGHT FOOT COMPLETE - 3+ VIEW COMPARISON:  None Available. FINDINGS: No acute or healing fracture of the foot. The alignment is normal, no dislocation. Minor degenerative change of the first metatarsal phalangeal joint. No erosions or periostitis. Generalized soft tissue edema most prominently over the dorsum of the foot. No radiopaque foreign body or soft tissue gas. IMPRESSION: Generalized soft tissue edema, prominent over the dorsum of the foot. No acute or healing fracture. Electronically Signed   By: Narda Rutherford M.D.   On: 10/19/2022 16:30    Procedures Procedures    Medications Ordered in ED Medications  doxycycline (VIBRA-TABS) tablet 100 mg (has no administration in time range)    ED Course/ Medical Decision Making/ A&P                                 Medical Decision Making Amount and/or Complexity of Data Reviewed Labs: ordered. Radiology: ordered.  Risk Prescription drug management.   Mandy Best is here with right foot pain.  Unremarkable vitals.  No real major significant medical problems.  No history of diabetes.  Differential diagnosis likely hematoma versus may be infectious process.  Basic labs were obtained that showed no significant anemia or electrolyte abnormality or kidney injury or leukocytosis.  Patient with x-ray that shows no fracture.  She is got swelling over the top of the right foot.  Could be a traumatic hematoma now with some cellulitis.  I talked with Dr. Everardo Pacific PA McBane who recommends starting antibiotics and they will follow-up with her outpatient.  Will put her in a postop shoe.  They recommend against doing an I&D.  I suspect that this is a traumatic hematoma.  Overall she is neurovascularly neuromuscular intact on exam.  She understands return precautions and understands follow-up instructions.  Given a dose of doxycycline before leaving here.  I offered her narcotic pain medicine for  home but she declined.  This chart was dictated using voice recognition software.  Despite best efforts to proofread,  errors can occur which can change the documentation meaning.         Final Clinical Impression(s) / ED Diagnoses Final diagnoses:  Right foot pain  Cellulitis, unspecified cellulitis site    Rx / DC Orders ED Discharge Orders          Ordered    doxycycline (VIBRAMYCIN) 100 MG capsule  2 times daily        10/19/22 1821              Virgina Norfolk, DO 10/19/22 1826

## 2022-10-19 NOTE — ED Notes (Signed)
 RN reviewed discharge instructions with pt. Pt verbalized understanding and had no further questions. VSS upon discharge.  

## 2022-10-19 NOTE — Discharge Instructions (Addendum)
Take antibiotics as prescribed.  Take next dose tomorrow morning.  Use postop boot for comfort.  Call orthopedics tomorrow to schedule follow-up appointment.

## 2022-10-26 ENCOUNTER — Ambulatory Visit: Payer: Medicare PPO | Admitting: Orthopedic Surgery

## 2022-10-26 ENCOUNTER — Encounter: Payer: Self-pay | Admitting: Orthopedic Surgery

## 2022-10-26 DIAGNOSIS — S9781XA Crushing injury of right foot, initial encounter: Secondary | ICD-10-CM

## 2022-10-26 NOTE — Progress Notes (Signed)
Office Visit Note   Patient: Mandy Best           Date of Birth: 02/24/1951           MRN: [id number] Visit Date: 10/26/2022              Requested by: Vivien Presto, MD [phone] [address] [place],  [place] [address] PCP: Vivien Presto, MD  Chief Complaint  Patient presents with   Right Foot - Pain      HPI: Patient is a 72 year old woman is seen for initial evaluation for crush injury to her right foot.  She states that a stack of metal roofing fell on the dorsum of her foot a few weeks ago.  Patient states the swelling is improving.  Patient has been on doxycycline stopped this yesterday.  Assessment & Plan: Visit Diagnoses:  1. Crushing injury of right foot, initial encounter     Plan: Recommended she continue with range of motion of the toes and ankle.  She may stop her doxycycline.  Continue compression and elevation.  Follow-Up Instructions: Return in about 4 weeks (around 11/23/2022).   Ortho Exam  Patient is alert, oriented, no adenopathy, well-dressed, normal affect, normal respiratory effort. Examination of the radiographs shows no fractures no evidence of injury across the Lisfranc complex.  Patient has a palpable posterior tibial pulse.  The foot is swollen and she has good active and passive range of motion of her toes without pain no evidence of a compartment syndrome.  There is a superficial 2 cm eschar over the dorsum of the midfoot there is no cellulitis odor or drainage no full-thickness skin defect.  Imaging: No results found. No images are attached to the encounter.  Labs: Lab Results  Component Value Date   ESRSEDRATE 9 02/23/2014     Lab Results  Component Value Date   ALBUMIN 3.9 02/23/2014    No results found for: "MG" No results found for: "VD25OH"  No results found for: "PREALBUMIN"    Latest Ref Rng & Units 10/19/2022    5:04 PM 02/23/2014    4:47 PM 08/21/2011    6:52 AM  CBC EXTENDED  WBC 4.0 - 10.5 K/uL 7.6  8.4    RBC  3.87 - 5.11 MIL/uL 4.45  4.86    Hemoglobin 12.0 - 15.0 g/dL 78.2  95.6  21.3   HCT 36.0 - 46.0 % 39.4  42.0    Platelets 150 - 400 K/uL 336  327.0    NEUT# 1.7 - 7.7 K/uL 5.3  6.1    Lymph# 0.7 - 4.0 K/uL 1.6  1.6       There is no height or weight on file to calculate BMI.  Orders:  No orders of the defined types were placed in this encounter.  No orders of the defined types were placed in this encounter.    Procedures: No procedures performed  Clinical Data: No additional findings.  ROS:  All other systems negative, except as noted in the HPI. Review of Systems  Objective: Vital Signs: There were no vitals taken for this visit.  Specialty Comments:  No specialty comments available.  PMFS History: Patient Active Problem List   Diagnosis Date Noted   Chronic fatigue 02/27/2014   Insomnia 11/30/2010   Past Medical History:  Diagnosis Date   BPPV (benign paroxysmal positional vertigo)    Breast cancer (HCC) 1999   Left   Carpal tunnel syndrome  Chronic headaches    Insomnia    Osteoporosis    Personal history of radiation therapy     Family History  Problem Relation Age of Onset   Diabetes Mother     Past Surgical History:  Procedure Laterality Date   AUGMENTATION MAMMAPLASTY Bilateral    BREAST ENHANCEMENT SURGERY  2003   BREAST LUMPECTOMY Left 1999   BREAST RECONSTRUCTION  2003   Bilateral   DIAGNOSTIC LAPAROSCOPY  ~1998   ovarian cyst removal   PARTIAL HYSTERECTOMY  1972   Social History   Occupational History   Occupation: Research officer, trade [employer]    Comment: disabled  Tobacco Use   Smoking status: Former    Current packs/day: 0.00    Types: Cigarettes    Start date: 03/17/1975    Quit date: 03/16/1978    Years since quitting: 44.6   Smokeless tobacco: Never   Tobacco comments:    1 pack x 2 weeks  Vaping Use   Vaping status: Never Used  Substance and Sexual Activity   Alcohol use: No   Drug use: No   Sexual activity: Not on file

## 2022-10-28 ENCOUNTER — Telehealth: Payer: Self-pay | Admitting: Orthopedic Surgery

## 2022-10-28 NOTE — Telephone Encounter (Signed)
Called patient

## 2022-10-28 NOTE — Telephone Encounter (Signed)
Pt called in for [facility] May left VM yesterday, pt would like for her to call it is very important concerning her visit with Dr. Lajoyce Corners. She was transferred to [facility] but would still like to speak with Toniann Fail, asked for her specifically

## 2023-06-08 ENCOUNTER — Other Ambulatory Visit: Payer: Self-pay | Admitting: Family Medicine

## 2023-06-08 DIAGNOSIS — Z1231 Encounter for screening mammogram for malignant neoplasm of breast: Secondary | ICD-10-CM

## 2023-07-13 ENCOUNTER — Ambulatory Visit

## 2023-08-02 ENCOUNTER — Ambulatory Visit
Admission: RE | Admit: 2023-08-02 | Discharge: 2023-08-02 | Disposition: A | Discharge status: Home / Self Care | Source: Ambulatory Visit | Attending: Family Medicine | Admitting: Family Medicine

## 2023-08-02 DIAGNOSIS — Z1231 Encounter for screening mammogram for malignant neoplasm of breast: Secondary | ICD-10-CM

## 2023-08-06 ENCOUNTER — Other Ambulatory Visit: Payer: Self-pay | Admitting: Family Medicine

## 2023-08-06 DIAGNOSIS — R928 Other abnormal and inconclusive findings on diagnostic imaging of breast: Secondary | ICD-10-CM

## 2023-08-24 ENCOUNTER — Other Ambulatory Visit: Payer: Self-pay | Admitting: Family Medicine

## 2023-08-24 ENCOUNTER — Ambulatory Visit
Admission: RE | Admit: 2023-08-24 | Discharge: 2023-08-24 | Disposition: A | Discharge status: Home / Self Care | Source: Ambulatory Visit | Attending: Family Medicine | Admitting: Family Medicine

## 2023-08-24 DIAGNOSIS — R928 Other abnormal and inconclusive findings on diagnostic imaging of breast: Secondary | ICD-10-CM

## 2023-08-24 DIAGNOSIS — R599 Enlarged lymph nodes, unspecified: Secondary | ICD-10-CM

## 2023-08-26 ENCOUNTER — Encounter

## 2023-08-26 ENCOUNTER — Other Ambulatory Visit

## 2023-11-29 ENCOUNTER — Other Ambulatory Visit

## 2023-12-03 ENCOUNTER — Ambulatory Visit
Admission: RE | Admit: 2023-12-03 | Discharge: 2023-12-03 | Disposition: A | Discharge status: Home / Self Care | Source: Ambulatory Visit | Attending: Family Medicine | Admitting: Family Medicine

## 2023-12-03 DIAGNOSIS — R599 Enlarged lymph nodes, unspecified: Secondary | ICD-10-CM

## 2023-12-06 ENCOUNTER — Other Ambulatory Visit: Payer: Self-pay | Admitting: Family Medicine

## 2023-12-06 DIAGNOSIS — N6321 Unspecified lump in the left breast, upper outer quadrant: Secondary | ICD-10-CM

## 2024-08-01 ENCOUNTER — Encounter

## 2024-08-01 ENCOUNTER — Other Ambulatory Visit

## 2024-08-04 ENCOUNTER — Other Ambulatory Visit

## 2024-08-04 ENCOUNTER — Encounter
